# Patient Record
Sex: Female | Born: 1955
Health system: Southern US, Community
[De-identification: ages and names within clinical notes are randomized; demographics above are authoritative.]

## PROBLEM LIST (undated history)

## (undated) DIAGNOSIS — H50811 Duane's syndrome, right eye: Secondary | ICD-10-CM

## (undated) DIAGNOSIS — I10 Essential (primary) hypertension: Secondary | ICD-10-CM

## (undated) HISTORY — PX: EYE SURGERY: SHX253

---

## 1997-09-01 ENCOUNTER — Ambulatory Visit (HOSPITAL_COMMUNITY): Admission: RE | Admit: 1997-09-01 | Discharge: 1997-09-01 | Payer: Self-pay | Admitting: Obstetrics and Gynecology

## 1998-09-06 ENCOUNTER — Ambulatory Visit (HOSPITAL_BASED_OUTPATIENT_CLINIC_OR_DEPARTMENT_OTHER): Admission: RE | Admit: 1998-09-06 | Discharge: 1998-09-06 | Payer: Self-pay | Admitting: Plastic Surgery

## 1998-09-18 ENCOUNTER — Ambulatory Visit (HOSPITAL_COMMUNITY): Admission: RE | Admit: 1998-09-18 | Discharge: 1998-09-18 | Payer: Self-pay | Admitting: Obstetrics and Gynecology

## 1998-09-18 ENCOUNTER — Encounter: Payer: Self-pay | Admitting: Obstetrics and Gynecology

## 1998-12-12 ENCOUNTER — Other Ambulatory Visit: Admission: RE | Admit: 1998-12-12 | Discharge: 1998-12-12 | Payer: Self-pay | Admitting: Obstetrics and Gynecology

## 1999-04-23 ENCOUNTER — Ambulatory Visit (HOSPITAL_COMMUNITY): Admission: RE | Admit: 1999-04-23 | Discharge: 1999-04-23 | Payer: Self-pay | Admitting: Obstetrics and Gynecology

## 1999-04-23 ENCOUNTER — Encounter: Payer: Self-pay | Admitting: Obstetrics and Gynecology

## 1999-10-09 ENCOUNTER — Encounter: Payer: Self-pay | Admitting: Obstetrics and Gynecology

## 1999-10-09 ENCOUNTER — Encounter: Admission: RE | Admit: 1999-10-09 | Discharge: 1999-10-09 | Payer: Self-pay | Admitting: Obstetrics and Gynecology

## 2000-02-21 ENCOUNTER — Other Ambulatory Visit: Admission: RE | Admit: 2000-02-21 | Discharge: 2000-02-21 | Payer: Self-pay | Admitting: Obstetrics and Gynecology

## 2000-10-27 ENCOUNTER — Ambulatory Visit (HOSPITAL_COMMUNITY): Admission: RE | Admit: 2000-10-27 | Discharge: 2000-10-27 | Payer: Self-pay | Admitting: Obstetrics and Gynecology

## 2000-10-27 ENCOUNTER — Encounter: Payer: Self-pay | Admitting: Obstetrics and Gynecology

## 2001-04-21 ENCOUNTER — Other Ambulatory Visit: Admission: RE | Admit: 2001-04-21 | Discharge: 2001-04-21 | Payer: Self-pay | Admitting: Obstetrics and Gynecology

## 2001-10-27 ENCOUNTER — Encounter: Admission: RE | Admit: 2001-10-27 | Discharge: 2001-10-27 | Payer: Self-pay | Admitting: Obstetrics and Gynecology

## 2001-10-27 ENCOUNTER — Encounter: Payer: Self-pay | Admitting: Obstetrics and Gynecology

## 2002-08-02 ENCOUNTER — Other Ambulatory Visit: Admission: RE | Admit: 2002-08-02 | Discharge: 2002-08-02 | Payer: Self-pay | Admitting: Obstetrics and Gynecology

## 2002-12-13 ENCOUNTER — Encounter: Admission: RE | Admit: 2002-12-13 | Discharge: 2002-12-13 | Payer: Self-pay | Admitting: Obstetrics and Gynecology

## 2002-12-13 ENCOUNTER — Encounter: Payer: Self-pay | Admitting: Obstetrics and Gynecology

## 2003-10-20 ENCOUNTER — Other Ambulatory Visit: Admission: RE | Admit: 2003-10-20 | Discharge: 2003-10-20 | Payer: Self-pay | Admitting: Obstetrics and Gynecology

## 2004-03-14 ENCOUNTER — Encounter: Admission: RE | Admit: 2004-03-14 | Discharge: 2004-03-14 | Payer: Self-pay | Admitting: Obstetrics and Gynecology

## 2005-04-26 ENCOUNTER — Encounter: Admission: RE | Admit: 2005-04-26 | Discharge: 2005-04-26 | Payer: Self-pay | Admitting: Obstetrics and Gynecology

## 2006-09-02 ENCOUNTER — Encounter: Admission: RE | Admit: 2006-09-02 | Discharge: 2006-09-02 | Payer: Self-pay | Admitting: Obstetrics and Gynecology

## 2007-11-11 ENCOUNTER — Encounter: Admission: RE | Admit: 2007-11-11 | Discharge: 2007-11-11 | Payer: Self-pay | Admitting: Obstetrics and Gynecology

## 2008-11-11 ENCOUNTER — Encounter: Admission: RE | Admit: 2008-11-11 | Discharge: 2008-11-11 | Payer: Self-pay | Admitting: Obstetrics and Gynecology

## 2009-11-24 ENCOUNTER — Encounter: Admission: RE | Admit: 2009-11-24 | Discharge: 2009-11-24 | Payer: Self-pay | Admitting: Obstetrics and Gynecology

## 2010-09-07 ENCOUNTER — Encounter: Payer: Self-pay | Admitting: Sports Medicine

## 2010-09-07 ENCOUNTER — Ambulatory Visit (INDEPENDENT_AMBULATORY_CARE_PROVIDER_SITE_OTHER): Payer: BC Managed Care – PPO | Admitting: Sports Medicine

## 2010-09-07 DIAGNOSIS — S79919A Unspecified injury of unspecified hip, initial encounter: Secondary | ICD-10-CM

## 2010-09-07 DIAGNOSIS — R1030 Lower abdominal pain, unspecified: Secondary | ICD-10-CM

## 2010-09-07 DIAGNOSIS — M25561 Pain in right knee: Secondary | ICD-10-CM | POA: Insufficient documentation

## 2010-09-07 DIAGNOSIS — M25569 Pain in unspecified knee: Secondary | ICD-10-CM

## 2010-09-07 DIAGNOSIS — R109 Unspecified abdominal pain: Secondary | ICD-10-CM

## 2010-09-07 DIAGNOSIS — S76201A Unspecified injury of adductor muscle, fascia and tendon of right thigh, initial encounter: Secondary | ICD-10-CM

## 2010-09-07 MED ORDER — NITROGLYCERIN 0.2 MG/HR TD PT24: MEDICATED_PATCH | TRANSDERMAL | Status: DC

## 2010-09-07 NOTE — Assessment & Plan Note (Addendum)
-   Right groin pain secondary to partial tear of hip adductor which was noted on [facility] ultrasound. - Initiate nitroglycerin protocol as noted in patient instructions - reviewed common side effects including headache and rash.  - Fitted with body helix by sleeve to wear with activity for added compression - Start gentle home exercise program as noted in patient instructions - Followup 6 weeks for reevaluation and repeat ultrasound

## 2010-09-07 NOTE — Assessment & Plan Note (Signed)
Right medial knee pain likely secondary to altered gait mechanics from abductor injury. Was noted to have increased right foot turned out with walking which is likely putting more stress on the medial aspect of her knee. This is less noticeable with running gait. Suspect managing abductor injury will improve her knee pain. No acute abnormalities appreciated on ultrasound of the knee.

## 2010-09-07 NOTE — Progress Notes (Signed)
  Subjective:    Patient ID: Natasha Turner, female    DOB: Jan 18, 1956, 55 y.o.   MRN: [id number]  HPI The 55 year old active female to the office for evaluation of right knee and right groin pain. Pain has been ongoing for the past 6 weeks, denies any specific injury or trauma. She runs regularly approximately 20 miles a week, but is also doing yoga. She's been noting increasing pain in the medial knee and groin with stairs and using the elliptical backwards. No pain with using the elliptical forward or with stationary bike. Also noting increased pain with certain motions including sitting [place] style and certain yoga positions. Has been taking Motrin as needed with some relief. Denies any previous injury to her knee or her groin. Denies any bruising or swelling. Denies any mechanical symptoms in the knee.   Review of Systems Per history of present illness, otherwise negative    Objective:   Physical Exam GENERAL: Alert and oriented x3, no acute distress, pleasant SKIN: No rashes or lesions MSK: - Knee: Right knee with full range of motion with minimal patellofemoral crepitus. She has no swelling or effusion. She is mildly tender palpation along the lateral patellar facet, no tenderness over the medial or lateral joint lines, patellar tendon, quadriceps tendon. She has negative patellar apprehension. She has positive patellar grind. No ligamentous laxity. Negative McMurray's. Left knee with full range of motion without pain, tenderness, swelling, weakness, or instability. - Hip:  Right hip with full range of motion, does have some pain with external rotation. Tender to palpation along adductor musculature and muscle belly with palpable nodule, no defects are appreciated. No surrounding erythema or bruising. Mild weakness of the abductor musculature secondary to pain.  Mild pain & weakness with sartorius testing. Otherwise hip strength is normal. Normal hip strength on the left. - Gait: No leg  length difference. With walking and noted to have external rotation of her right foot. No limp. Good running form with the forefoot and mid foot strike, right foot is more midline with running. Neurovascular intact distally  [facility]K ultrasound: - Right knee with normal-appearing quadriceps tendon, no increase fluid in suprapatellar pouch. Normal-appearing radial and lateral meniscus. Normal-appearing patellar tendon. - Right adductor with hyperechoic area within the muscle belly consistent with previous injury/tearing and formation of scar tissue.  Areas of hypoechoic change within muscle belly with surrounding increased Doppler flow. Images saved       Assessment & Plan:

## 2010-09-07 NOTE — Patient Instructions (Signed)
1) Your ultrasound showed scar tissue within your adductor & some increased blood flow consistent with healing old tear. 2) Start NTG 1/4 patch to right groin - leave in place x 24 hrs, then change daily. 3) Wear thigh sleeve with activity. 4) Start doing exercises: - Hip adduction - 3 sets of 30 without weight (should not cause pain >3/10) - Hip abduction - 3 sets of 30 without weight - Quad sets with adduction using a ball --> squeeze ball between the knees and do leg extension - 3 sets of 30 without weight (should not cause pain >3/10) 5) Ok to continue running & yoga - avoid any activities that are causing pain >3/10 6) Follow-up 6-weeks for repeat ultrasound

## 2010-09-07 NOTE — Assessment & Plan Note (Signed)
Right groin pain secondary to partial tearing of adductor musculature as noted on [facility] ultrasound. Thigh sleeve, nitroglycerin protocol, home exercises as noted above. Reevaluate in 6 weeks.

## 2010-10-16 ENCOUNTER — Encounter: Payer: Self-pay | Admitting: Family Medicine

## 2010-10-16 ENCOUNTER — Ambulatory Visit (INDEPENDENT_AMBULATORY_CARE_PROVIDER_SITE_OTHER): Payer: BC Managed Care – PPO | Admitting: Family Medicine

## 2010-10-16 VITALS — BP 138/82 | HR 73

## 2010-10-16 DIAGNOSIS — S76201A Unspecified injury of adductor muscle, fascia and tendon of right thigh, initial encounter: Secondary | ICD-10-CM

## 2010-10-16 DIAGNOSIS — S79929A Unspecified injury of unspecified thigh, initial encounter: Secondary | ICD-10-CM

## 2010-10-16 NOTE — Assessment & Plan Note (Signed)
-   Right groin pain secondary to partial tear of hip adductor - 50% improved today and signs of healing and increased Doppler flow on ultrasound today - Continue nitroglycerin protocol quarter patch daily - Continue to use body helix by sleeve - Continue hip exercises - Followup 6-8 weeks for repeat ultrasound and reevaluation

## 2010-10-16 NOTE — Progress Notes (Signed)
  Subjective:    Patient ID: Natasha Turner, female    DOB: November 15, 1955, 55 y.o.   MRN: [id number]  HPI 55 year old female to our office for followup of partial tear of right abductor muscles. Symptoms 50% improved with nitroglycerin protocol and home exercises. Denies any adverse effects from the nitroglycerin. Has been wearing thigh sleeve which is comfortable. Has been running 16-20 miles per week which has been comfortable. Also doing hot yoga.   Review of Systems Per history of present illness, otherwise negative    Objective:   Physical Exam GENERAL: Alert and oriented x3, no acute distress, pleasant  MSK:  - Hip: Right hip with full range of motion, does have slight pain with external rotation. Mildly tender to palpation along adductor musculature and muscle belly with palpable nodule, no defects are appreciated. No surrounding erythema or bruising. Mild weakness of the abductor musculature - but improving. Mild pain & weakness with sartorius testing - but improving. Otherwise hip strength is normal. Normal hip strength on the left.  - Gait: No leg length difference. With walking and noted to have external rotation of her right foot. No limp.  Neurovascular intact distally   [facility]K ultrasound:   Right adductor with hyperechoic area within the muscle belly consistent with previous injury/tearing and formation of scar tissue - area appears smaller today compared to previous u/s. Decreased areas of hypoechoic change within muscle belly.  Increased doppler flow & neovessels noted.  Images saved        Assessment & Plan:

## 2010-11-23 ENCOUNTER — Other Ambulatory Visit: Payer: Self-pay | Admitting: Obstetrics & Gynecology

## 2010-11-23 DIAGNOSIS — Z1231 Encounter for screening mammogram for malignant neoplasm of breast: Secondary | ICD-10-CM

## 2010-12-03 ENCOUNTER — Ambulatory Visit
Admission: RE | Admit: 2010-12-03 | Discharge: 2010-12-03 | Disposition: A | Discharge status: Home / Self Care | Payer: BC Managed Care – PPO | Source: Ambulatory Visit | Attending: Obstetrics & Gynecology | Admitting: Obstetrics & Gynecology

## 2010-12-03 DIAGNOSIS — Z1231 Encounter for screening mammogram for malignant neoplasm of breast: Secondary | ICD-10-CM

## 2010-12-24 ENCOUNTER — Ambulatory Visit (INDEPENDENT_AMBULATORY_CARE_PROVIDER_SITE_OTHER): Payer: BC Managed Care – PPO | Admitting: Sports Medicine

## 2010-12-24 VITALS — BP 138/88

## 2010-12-24 DIAGNOSIS — S83249A Other tear of medial meniscus, current injury, unspecified knee, initial encounter: Secondary | ICD-10-CM | POA: Insufficient documentation

## 2010-12-24 DIAGNOSIS — IMO0002 Reserved for concepts with insufficient information to code with codable children: Secondary | ICD-10-CM

## 2010-12-24 DIAGNOSIS — M25569 Pain in unspecified knee: Secondary | ICD-10-CM

## 2010-12-24 DIAGNOSIS — M25561 Pain in right knee: Secondary | ICD-10-CM

## 2010-12-24 NOTE — Assessment & Plan Note (Signed)
She was given a compression sleeve for her knee today  I think she should wear this for the next 4-6 weeks  She is to limit activity to what does not increase her knee pain  Begin some straight leg raises and limited knee extensions  Okay to try the bike and other work adequately but does not create pain  Recheck in 4-6 weeks

## 2010-12-24 NOTE — Progress Notes (Signed)
  Subjective:    Patient ID: Natasha Turner, female    DOB: 1956-05-08, 55 y.o.   MRN: [id number]  HPI  On august 4 coming down step;  Rotated to RT and immediately twisted RT knee. Felt pain in RT medial knee and post area. Swelling started in < hour along med jt line.  Hard to walk. Could not straighten knee completely for 3 to 4 days. Now with pain that is more w use.   Using on OTC sleeve and icing.  Knee is still painful if she tries running or any fast walking  Review of Systems     Objective:   Physical Exam  No acute distress  Left Knee: Normal to inspection with no erythema or effusion or obvious bony abnormalities. Palpation normal with no warmth or joint line tenderness or patellar tenderness or condyle tenderness. ROM normal in flexion and extension and lower leg rotation. Ligaments with solid consistent endpoints including ACL, PCL, LCL, MCL. Negative Mcmurray's and provocative meniscal tests. Non painful patellar compression. Patellar and quadriceps tendons unremarkable. Hamstring and quadriceps strength is normal.  RT Knee Pain along the medial joint line is mild Unable to fully extend the right knee by 3 McMurray test is not painful nor is the [place] However the right knee does not feel as stable Ligaments do appear stable  [facility] ultrasound There is a small amount of effusion persisting in the supra patellar pouch Quadriceps tendon and patellar tendon are intact Lateral meniscus intact Medial meniscus shows a small area that is a possible separated fragment only in the posterior half of the joint line but not posteriorly        Assessment & Plan:

## 2010-12-24 NOTE — Assessment & Plan Note (Signed)
Pain is lessened she will continue using over-the-counter medications

## 2011-02-18 ENCOUNTER — Ambulatory Visit (INDEPENDENT_AMBULATORY_CARE_PROVIDER_SITE_OTHER): Payer: BC Managed Care – PPO | Admitting: Family Medicine

## 2011-02-18 ENCOUNTER — Encounter: Payer: Self-pay | Admitting: Family Medicine

## 2011-02-18 DIAGNOSIS — E785 Hyperlipidemia, unspecified: Secondary | ICD-10-CM

## 2011-02-18 NOTE — Progress Notes (Signed)
Medical Nutrition Therapy:  Appt start time: 1130 end time:  1230.  Assessment:  Primary concerns today: hyperlipidemia.  Natasha Turner had her lipid panel and FBG done in August, and was surprised to see an LDL of 174 and FBG of 111.  Since June Natasha Turner has eaten especially well, as her daughter started doing [employer], and she has lost from ~120 lb.  She tore her R meniscus in Aug, which has limited exercise some (usual physical activity used to be yoga 4-5 X wk and running 5-6 X wk [20 mpw]), but she has become even more careful with her diet b/c of lab results, and weight has continued to fall.   Usual eating pattern includes 3 meals and 2 snacks per day.  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 her BG levels as well as LDL.  (HDL is 88.) Everyday foods include ~32 oz unsweetened tea, and frequently consumed foods include sweet potatoes, peppers, black beans, and [place].  Avoided foods include liver.   24-hr recall suggests an intake of <1500 kcal: B (9 AM)- 3/4 c eggs & sausage casserole, black coffee; L (driving back from [place]) (1 PM)- 1 Kashi bar (140 kcal), water & tea; (30 min bike ride); Snk (3:30 PM)- 1/4 c blkeyed peas, salsa, tomatoes, 10 flax chips, <2 Tbsp hummus, 4 pita chips, tea; D (7:30 PM)- [place] rest:  2 1/2 crispy shrimp, sea bass, 1/3 c rice, broccoli, water; Snk (9 PM)- hot green tea.  She was at a church retreat yesterday.   Lunch on weekdays is usually ~12 oz red beans and lentils or leftover soup with a piece of fruit.  Snacks are usually Kashi bars or fruit.    Progress Towards Goal(s):  In progress.   Nutritional Diagnosis:  NI-5.8.5 Inadeqate fiber intake As related to fruits and vegetables.  As evidenced by usual intake of no veg's at lunch time.    Intervention:  Nutrition education.  Monitoring/Evaluation:  Dietary intake, exercise, and body weight prn.

## 2011-02-18 NOTE — Patient Instructions (Addendum)
-   Principles of lipid management:  - Limit saturated fat (red meat, whole milk dairy foods, butter, fried foods).   (Continue to limit cheese to 1-2 oz a few times a week.)  - Limit trans fat (fried foods, some condiments, some baked foods [read labels])  - Incorporate more polyunsaturated fats (liquid at room temperature) and mono-unsaturated fats (olive & canola oils)   - Include unsalted nuts and seeds, and avocado.   - Increase soluble fiber (flax seeds, fruit, vegetables, and beans) and cereal grains (bran).  In choosing cereal, look for at least 5 grams of fiber per serving.    - Have fish (nonfried) at least twice a week.   - For your BG, including some protein each time you eat is a good idea.   - Suggestion:  [employer] brand frozen wild blueberries with [place] yogurt and mixed nuts.   - When you have beans for your main protein source, you need at least a full cup.   - Oatmeal is a good source of soluble fiber.  A quick way to prepare it is to soak thick rolled oats in boiling water, covered, overnight, then cook briefly in the morning.   - Alcohol:  Continue to consume moderately (no more than one drink per time; no more than 4 X wk).   - Vegetables:  Obtain twice a day! - When you have hummus, use a combination of veg's and whole grain pita.

## 2011-02-20 ENCOUNTER — Ambulatory Visit (INDEPENDENT_AMBULATORY_CARE_PROVIDER_SITE_OTHER): Payer: BC Managed Care – PPO | Admitting: Sports Medicine

## 2011-02-20 ENCOUNTER — Ambulatory Visit (HOSPITAL_COMMUNITY)
Admission: RE | Admit: 2011-02-20 | Discharge: 2011-02-20 | Disposition: A | Discharge status: Home / Self Care | Payer: BC Managed Care – PPO | Source: Ambulatory Visit | Attending: Sports Medicine | Admitting: Sports Medicine

## 2011-02-20 ENCOUNTER — Encounter: Payer: Self-pay | Admitting: Sports Medicine

## 2011-02-20 VITALS — BP 130/85 | HR 67 | Ht 63.0 in | Wt 109.0 lb

## 2011-02-20 DIAGNOSIS — S83249A Other tear of medial meniscus, current injury, unspecified knee, initial encounter: Secondary | ICD-10-CM

## 2011-02-20 DIAGNOSIS — IMO0002 Reserved for concepts with insufficient information to code with codable children: Secondary | ICD-10-CM

## 2011-02-20 DIAGNOSIS — M25569 Pain in unspecified knee: Secondary | ICD-10-CM | POA: Insufficient documentation

## 2011-02-20 DIAGNOSIS — M25561 Pain in right knee: Secondary | ICD-10-CM

## 2011-02-20 DIAGNOSIS — M25562 Pain in left knee: Secondary | ICD-10-CM

## 2011-02-20 NOTE — Progress Notes (Signed)
PT referral info faxed to [facility] per patient's request.

## 2011-02-20 NOTE — Progress Notes (Signed)
  Subjective:    Patient ID: Natasha Turner, female    DOB: 03/14/1956, 55 y.o.   MRN: [id number]  HPI   On august 4 coming down step;  Rotated to RT and immediately twisted RT knee. Felt pain in RT medial knee and post area. Swelling started in < hour along med jt line.  Hard to walk. Could not straighten knee completely for 3 to 4 days. Most recently seen approx 6 weeks ago and since then has been doing home exercises and wearing knee sleeve. With slow return to activity.  Denies significant pain, admits to intermittent clicking and locking with "almost giving out:" feeling more medially. Admits to intermittent pain/discomfort laterally and extending down lateral lower leg. Unable to fully extend leg and gets pressure posteriorly in knee after approx 30 minutes of walking/running.  Review of Systems      Objective:   Physical Exam   GEN: NAD, well developed CV: distal pulses intact Lung: normal resp effort  Left Knee: no assymetry/erythema/swelling. nontender to touch. FROM. 5/5 strength. Intact joint.  RT Knee: slightly decrease in VMO size compared to L. Mild ttp superior to fibular head, no medial joint line tenderness. Full flexion with mild pressure posteriorly. Decreased extension by approx 3 degree. 5/5 strength in flexion. Hamstring with decreased strength compared to L.  [facility] ultrasound R knee: There is improved effusion in suprapatellar pouch. Quadriceps tendon and patellar tendon are intact Lateral meniscus with slight calcification and thinner when compared to L. Medial meniscus with improved appearance and mild thinning posteriorly.       Assessment & Plan:  *R knee pain 2/2 medial meniscal tear with possible lateral meniscal involvement -will get standing b/l xrays today to monitor osteochondral status -continue with home exercises and wearing knee sleeve -will refer to PT for improved effort in achieving full extension in R knee  Note with bilateral knee  issues and good response to body helix on 1 knee we will try 1 for left knee as well

## 2011-02-20 NOTE — Patient Instructions (Signed)
Home Exercise Program 3 sets of 15  Cross over step ups Hip flexion with rotation Straight leg raises lateral Straight hip flexion

## 2011-02-20 NOTE — Assessment & Plan Note (Signed)
Pain and function improved  Still with extension deficit  Standing knee films done and no significant DJD  With this in mind I think we should move to using PT and try to improve strength and get full Extension on RT  Reck P 6 wks of PT

## 2011-02-20 NOTE — Assessment & Plan Note (Signed)
RT Meniscus functions seems essentially normal now  On scan she has small fragment of lateral meniscus Thinning of medial meniscus in 1 area  Cont to observe

## 2011-04-11 ENCOUNTER — Encounter: Payer: Self-pay | Admitting: Sports Medicine

## 2011-04-11 ENCOUNTER — Ambulatory Visit (INDEPENDENT_AMBULATORY_CARE_PROVIDER_SITE_OTHER): Payer: BC Managed Care – PPO | Admitting: Sports Medicine

## 2011-04-11 VITALS — BP 128/88 | HR 99

## 2011-04-11 DIAGNOSIS — S83249A Other tear of medial meniscus, current injury, unspecified knee, initial encounter: Secondary | ICD-10-CM | POA: Insufficient documentation

## 2011-04-11 DIAGNOSIS — IMO0002 Reserved for concepts with insufficient information to code with codable children: Secondary | ICD-10-CM

## 2011-04-11 DIAGNOSIS — M25561 Pain in right knee: Secondary | ICD-10-CM

## 2011-04-11 DIAGNOSIS — M25569 Pain in unspecified knee: Secondary | ICD-10-CM

## 2011-04-11 MED ORDER — MELOXICAM 15 MG PO TABS: 15.0000 mg | ORAL_TABLET | Freq: Every day | ORAL | Status: AC

## 2011-04-11 NOTE — Patient Instructions (Signed)
Mobic 15 mg po qd for 2 weeks Continue physical therapy  Consider Devil's claw Continue Knee sleeve with activities. F/U in January 2013

## 2011-04-11 NOTE — Progress Notes (Signed)
  Subjective:    Patient ID: Natasha Turner, female    DOB: 07/15/1955, 55 y.o.   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 She has been doing PT for 12 visit. She states that she has improved 70-75%. Her pain is mild, medial, 2/10 intensity, no radiated, mild swelling and pressure on and off. Pain with walking down stairs, catching once a week. However she is able to run 3 mi a day in the treadmill and after running and activities is when she feel most of the discomfort.  Patient Active Problem List  Diagnoses  . Knee pain, right  . Injury of adductor muscle, fascia, and tendon of right thigh  . Groin pain  . Tear of medial meniscus of knee  . Hyperlipidemia with target LDL less than 160  . Knee pain, bilateral   Current Outpatient Prescriptions on File Prior to Visit  Medication Sig Dispense Refill  . cholecalciferol (VITAMIN D) 1000 UNITS tablet Take 2,000 Units by mouth daily.        Marland Kitchen ELESTRIN 0.52 MG/0.87 GM (0.06%) GEL       . fish oil-omega-3 fatty acids 1000 MG capsule Take 2,400 mg by mouth daily.        . nitroGLYCERIN (NITRODUR - DOSED IN MG/24 HR) 0.2 mg/hr Apply 1/4 patch to right groin, leave in place x 24 hours and change daily  15 patch  2  . Phytosterol Esters (PHYTOSTEROL COMPLEX PO) Take 2,000 mg by mouth.        . progesterone (PROMETRIUM) 100 MG capsule Take 100 mg by mouth Daily.       Allergies  Allergen Reactions  . Penicillins            Review of Systems  Constitutional: Negative for fever, chills, diaphoresis and fatigue.  Musculoskeletal: Negative for back pain, joint swelling and gait problem.  Neurological: Negative for weakness and numbness.       Objective:   Physical Exam  Constitutional: She appears well-developed and well-nourished.       BP 128/88  Pulse 99   Pulmonary/Chest: Effort normal.  Musculoskeletal:       Right knee with intact skin, Extension  with -5 , full flexion. Patellofemoral mild crepitus present with flexion and extension. Patellofemoral compression  test +. No tenderness on the quad neither or patellar tendon. Ligaments intact. Lachman neg. Varus and valgus test at 0 and 30 degres neg Mild TTP in the mid joint line. Excellent quad muscle definition.  Tight hamstrings Normal gait without a limp.     Neurological: She is alert.  Skin: Skin is warm. No rash noted. No erythema. No pallor.  Psychiatric: She has a normal mood and affect.      Assessment & Plan:   1. Knee pain, right   2. Acute medial meniscal tear    Mobic 15 mg po qd for 2 weeks Continue physical therapy  Consider Devil's claw Continue Knee sleeve with activities. F/U in January 2013

## 2011-09-25 ENCOUNTER — Ambulatory Visit (INDEPENDENT_AMBULATORY_CARE_PROVIDER_SITE_OTHER): Payer: BC Managed Care – PPO | Admitting: Family Medicine

## 2011-09-25 VITALS — BP 118/80

## 2011-09-25 DIAGNOSIS — IMO0002 Reserved for concepts with insufficient information to code with codable children: Secondary | ICD-10-CM

## 2011-09-25 DIAGNOSIS — S83249A Other tear of medial meniscus, current injury, unspecified knee, initial encounter: Secondary | ICD-10-CM

## 2011-10-08 NOTE — Progress Notes (Signed)
  Subjective:    Patient ID: Natasha Turner, female    DOB: 03-Dec-1955, 56 y.o.   MRN: [id number]  HPI 56 y/o female is following up for right knee pain.  She was diagnosed with a medial meniscus tear August 2012.  She has been trying to manage it conservatively but the pain is not resolved.  Recently she feels that it is a bit worse.  She has some catching but no locking.  She has returned to running and is able to run up to 4 miles.  She feels like she may have something inside of her knee.   Review of Systems     Objective:   Physical Exam  Knee: Normal to inspection with no erythema or effusion or obvious bony abnormalities. Mild joint line tenderness but no true Mcmurray. ROM normal in flexion and extension and lower leg rotation. Positive crepitus Negative Mcmurray's and provocative meniscal tests. Non painful patellar compression. Patellar and quadriceps tendons unremarkable.   Ultrasound: No effusion noted in the suprapatellar pouch.  No sign of a loose body.  Calcifications noted on the medial meniscus c/w her history of a previous tear.       Assessment & Plan:

## 2011-10-08 NOTE — Assessment & Plan Note (Signed)
We had a long discussion regarding whether or not she would like to pursue surgical evaluation for this knee pain.  We discussed absolute indications for surgical referral, relative indications for surgical referral, and continued conservative treatment.  Dr. Darrick Penna also reviewed these options with the patient.  She doesn't want to make any changes at this time.  She will contact Dr. Darrick Penna if she decides otherwise.

## 2011-12-19 ENCOUNTER — Other Ambulatory Visit: Payer: Self-pay | Admitting: Obstetrics & Gynecology

## 2011-12-19 DIAGNOSIS — Z1231 Encounter for screening mammogram for malignant neoplasm of breast: Secondary | ICD-10-CM

## 2011-12-20 ENCOUNTER — Other Ambulatory Visit: Payer: Self-pay | Admitting: Obstetrics & Gynecology

## 2011-12-20 DIAGNOSIS — M8580 Other specified disorders of bone density and structure, unspecified site: Secondary | ICD-10-CM

## 2012-01-07 ENCOUNTER — Ambulatory Visit
Admission: RE | Admit: 2012-01-07 | Discharge: 2012-01-07 | Disposition: A | Discharge status: Home / Self Care | Payer: BC Managed Care – PPO | Source: Ambulatory Visit | Attending: Obstetrics & Gynecology | Admitting: Obstetrics & Gynecology

## 2012-01-07 DIAGNOSIS — M8580 Other specified disorders of bone density and structure, unspecified site: Secondary | ICD-10-CM

## 2012-01-07 DIAGNOSIS — Z1231 Encounter for screening mammogram for malignant neoplasm of breast: Secondary | ICD-10-CM

## 2013-01-05 ENCOUNTER — Other Ambulatory Visit: Payer: Self-pay

## 2013-01-05 DIAGNOSIS — Z1231 Encounter for screening mammogram for malignant neoplasm of breast: Secondary | ICD-10-CM

## 2013-01-14 ENCOUNTER — Ambulatory Visit
Admission: RE | Admit: 2013-01-14 | Discharge: 2013-01-14 | Disposition: A | Discharge status: Home / Self Care | Payer: BC Managed Care – PPO | Source: Ambulatory Visit

## 2013-01-14 DIAGNOSIS — Z1231 Encounter for screening mammogram for malignant neoplasm of breast: Secondary | ICD-10-CM

## 2013-07-28 ENCOUNTER — Encounter: Payer: Self-pay | Admitting: Sports Medicine

## 2013-07-28 ENCOUNTER — Ambulatory Visit (INDEPENDENT_AMBULATORY_CARE_PROVIDER_SITE_OTHER): Payer: BC Managed Care – PPO | Admitting: Sports Medicine

## 2013-07-28 VITALS — BP 151/88 | HR 76 | Ht 63.0 in | Wt 109.0 lb

## 2013-07-28 DIAGNOSIS — M25561 Pain in right knee: Secondary | ICD-10-CM

## 2013-07-28 DIAGNOSIS — M25569 Pain in unspecified knee: Secondary | ICD-10-CM

## 2013-07-28 DIAGNOSIS — S83209A Unspecified tear of unspecified meniscus, current injury, unspecified knee, initial encounter: Secondary | ICD-10-CM

## 2013-07-28 DIAGNOSIS — IMO0002 Reserved for concepts with insufficient information to code with codable children: Secondary | ICD-10-CM

## 2013-07-28 NOTE — Patient Instructions (Signed)
Thank you for coming in today  1. Continue compression with running and walking 2. Take aleve or ibuprofen as needed 3. Work on quad strength          - Single leg squat at 45 degrees 3 x 20         - Continue spin class         - Single leg balance 3 x 30 sec          - Straight leg raise with toe straight, toe in, and toe out 3 x 15 4. Avoid deep squats, deep lunges, and steps  Followup as needed

## 2013-07-28 NOTE — Progress Notes (Signed)
CC: Followup right knee pain HPI: Patient is a very pleasant 58 year old female recreational runner who presents for right knee pain. She is still having some pinching in her right medial knee and occasionally it feels like it to give out. She does not really trust the knee. She has been doing spin classes 2-3 times a week and body pump classes weekly as well as yoga 3 times a week. She is at increased pain for the last several months after responding well to PT a couple years ago. Occasionally she will plans and given a sharp pain and pinching sensation. She tolerates spin class and yoga well but has some pain with walking and body pump. The knee feels stiff. She has been wearing a knee sleeve. She wants to make sure this is still appropriate.  ROS: As above in the HPI. All other systems are stable or negative.  OBJECTIVE: APPEARANCE:  Patient in no acute distress.The patient appeared well nourished and normally developed. HEENT: No scleral icterus. Conjunctiva non-injected Resp: Non labored Skin: No rash MSK:  Right Knee - Inspection normal with no erythema or effusion or obvious bony abnormalities.  - Palpation normal with no warmth but mild medial joint line tenderness. No TTP at patellar or quad tendon.  - ROM normal in flexion and extension. - Strength 5/5 in flexion and extension. - Ligaments with solid consistent endpoints including ACL, PCL, LCL, MCL.  - Painful Mcmurray's.  - Non painful patellar compression.  - Neurovascularly intact  MSK US: Limited ultrasound of the right knee was performed today. There is no increased hypoechoic fluid in the suprapatellar pouch. Medial joint line visualized from anterior to posterior. Anterior meniscus is normal in appearance. At the midportion of the medial meniscus at the site of the patient's pain there is decreased joint space as well as some spurring in this area. There is cystic change within the meniscus and thinning of the meniscus.  Posterior medial meniscus is normal and healthy in appearance.   ASSESSMENT: #1. 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 Continue compression sleeve during activity that precipitates pain. Avoid deep squats and lunges. Be careful a body pump classes to minimize painful activity. May also need to modify yoga positions. She was given a home exercise plan focusing on quad strengthening.

## 2013-08-10 ENCOUNTER — Ambulatory Visit: Payer: BC Managed Care – PPO | Admitting: Sports Medicine

## 2013-10-28 ENCOUNTER — Other Ambulatory Visit: Payer: Self-pay | Admitting: *Deleted

## 2013-10-28 DIAGNOSIS — M25561 Pain in right knee: Secondary | ICD-10-CM

## 2013-10-28 DIAGNOSIS — M25562 Pain in left knee: Secondary | ICD-10-CM

## 2013-10-28 DIAGNOSIS — S83249A Other tear of medial meniscus, current injury, unspecified knee, initial encounter: Secondary | ICD-10-CM

## 2014-01-10 ENCOUNTER — Other Ambulatory Visit: Payer: Self-pay

## 2014-01-10 DIAGNOSIS — Z1231 Encounter for screening mammogram for malignant neoplasm of breast: Secondary | ICD-10-CM

## 2014-01-12 ENCOUNTER — Ambulatory Visit (INDEPENDENT_AMBULATORY_CARE_PROVIDER_SITE_OTHER): Payer: BC Managed Care – PPO | Admitting: Sports Medicine

## 2014-01-12 ENCOUNTER — Encounter: Payer: Self-pay | Admitting: Sports Medicine

## 2014-01-12 VITALS — BP 145/97 | Ht 62.5 in | Wt 114.0 lb

## 2014-01-12 DIAGNOSIS — IMO0002 Reserved for concepts with insufficient information to code with codable children: Secondary | ICD-10-CM | POA: Diagnosis not present

## 2014-01-12 DIAGNOSIS — Z5189 Encounter for other specified aftercare: Secondary | ICD-10-CM | POA: Diagnosis not present

## 2014-01-12 DIAGNOSIS — S83241D Other tear of medial meniscus, current injury, right knee, subsequent encounter: Secondary | ICD-10-CM

## 2014-01-12 DIAGNOSIS — M25561 Pain in right knee: Secondary | ICD-10-CM

## 2014-01-12 DIAGNOSIS — M25569 Pain in unspecified knee: Secondary | ICD-10-CM

## 2014-01-12 NOTE — Progress Notes (Signed)
Patient ID: Natasha Turner, female   DOB: February 15, 1956, 57 y.o.   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 A lot of her symptoms did seem more patellofemoral  She feels much stronger since she is done the physical therapy 2 weeks ago she ran almost daily while on vacation That did not hurt her knee She is able to run up to about 5 miles before getting pain  She had one episode of sharp medial pain with some temporary locking in the right knee That was associated with some pain at the upper outer corner of the patella  A second problem is some sharp pain over the anterior left shoulder This seemed to occur after a body pump class It is still tender but does not feel weak  Examination No acute distress BP 145/97  Ht 5' 2.5" (1.588 m)  Wt 114 lb (51.71 kg)  BMI 20.51 kg/m2  RT Knee: Normal to inspection with no erythema or effusion or obvious bony abnormalities. Palpation normal with no warmth or joint line tenderness or patellar tenderness or condyle tenderness. ROM normal in flexion and extension and lower leg rotation. Ligaments with solid consistent endpoints including ACL, PCL, LCL, MCL. Negative Mcmurray's and provocative meniscal tests. Crepitation and mildly painful patellar compression. Patellar and quadriceps tendons unremarkable. Hamstring and quadriceps strength is normal.  She is now able to do step downsand 1 leg kneebends with good stability of the right knee  LT Shoulder: Inspection reveals no abnormalities, atrophy or asymmetry. Palpation is normal with no tenderness over AC joint or bicipital groove. ROM is full in all planes. Rotator cuff strength normal throughout. No signs of impingement with negative Neer and Hawkin's tests, empty can. She does get mild pain with testing  the supraspinatous and the subscapularis Speeds and Yergason's tests normal. No labral pathology noted with negative Obrien's, negative clunk and good stability. Normal scapular function observed. No painful arc and no drop arm sign. No apprehension sign  MSK ultrasound The right knee as compared to the left knee and there is no effusion in the suprapatellar pouch on either side Patellar and quad tendons normal Medial meniscus has one area of bulging but good cartilage preservation There is some degenerative meniscal tearing in the lateral meniscus  Left shoulder shows intact biceps, supraspinatous, subscapularis, infraspinatus and teres minor tendons

## 2014-01-12 NOTE — Assessment & Plan Note (Signed)
All she has some degenerative meniscal changes on ultrasound her examination is now normal and there is no swelling around the medial or lateral meniscus

## 2014-01-12 NOTE — Assessment & Plan Note (Signed)
This is improving with physical therapy Worked with physical therapy to develop a home program Continue using compression sleeve When necessary Aleve  Recheck 6 months

## 2014-01-18 ENCOUNTER — Ambulatory Visit
Admission: RE | Admit: 2014-01-18 | Discharge: 2014-01-18 | Disposition: A | Discharge status: Home / Self Care | Payer: BC Managed Care – PPO | Source: Ambulatory Visit

## 2014-01-18 DIAGNOSIS — Z1231 Encounter for screening mammogram for malignant neoplasm of breast: Secondary | ICD-10-CM

## 2014-04-13 ENCOUNTER — Other Ambulatory Visit (HOSPITAL_COMMUNITY)
Admission: RE | Admit: 2014-04-13 | Discharge: 2014-04-13 | Disposition: A | Discharge status: Home / Self Care | Payer: BC Managed Care – PPO | Source: Ambulatory Visit | Attending: Family Medicine | Admitting: Family Medicine

## 2014-04-13 ENCOUNTER — Other Ambulatory Visit: Payer: Self-pay | Admitting: Family Medicine

## 2014-04-13 DIAGNOSIS — Z01419 Encounter for gynecological examination (general) (routine) without abnormal findings: Secondary | ICD-10-CM | POA: Insufficient documentation

## 2014-04-14 LAB — CYTOLOGY - PAP

## 2014-11-28 ENCOUNTER — Encounter (HOSPITAL_BASED_OUTPATIENT_CLINIC_OR_DEPARTMENT_OTHER): Payer: Self-pay | Admitting: *Deleted

## 2014-11-29 ENCOUNTER — Ambulatory Visit: Payer: Self-pay | Admitting: Ophthalmology

## 2014-11-29 NOTE — H&P (Signed)
  Date of examination:  11-10-14  Indication for surgery: to correct misalignment of the eyes and allow some binocularity  Pertinent past medical history:  Past Medical History  Diagnosis Date  . Hypertension   . Duane's syndrome of right eye     Pertinent ocular history:  S/p strabismus surgery x 3 most recently 1972.  Exotropia documented to have been increasing since first seen by me '98.  Marked limitation of adduction of OD noted on first exam by me.  + fissure narrowing in attempted adduction OD, c/w Duane's.    Pertinent family history: No family history on file.  General:  Healthy appearing patient in no distress.    Eyes:    Acuity cc OD 20/20  OS 20/20  External: Within normal limits fissure narrowing OD in adduction  Anterior segment: Within normal limits x healed conj scars OU  Motility:   XT=45 in primary (with RHT 8).  XT 45 in R gaze, 70 in L gaze.  2-3- adduction OD.  1- abduction OD.    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    Plan: 1.  Explore right medial rectus muscle, advance/resect.  2.  Recess right lateral rectus muscle, adjustable.    Shara BlazingYOUNG,Natasha Turner

## 2014-11-30 ENCOUNTER — Encounter (HOSPITAL_BASED_OUTPATIENT_CLINIC_OR_DEPARTMENT_OTHER)
Admission: RE | Admit: 2014-11-30 | Discharge: 2014-11-30 | Disposition: A | Discharge status: Home / Self Care | Payer: BC Managed Care – PPO | Source: Ambulatory Visit | Attending: Ophthalmology | Admitting: Ophthalmology

## 2014-11-30 ENCOUNTER — Other Ambulatory Visit: Payer: Self-pay

## 2014-11-30 DIAGNOSIS — Z9889 Other specified postprocedural states: Secondary | ICD-10-CM | POA: Diagnosis not present

## 2014-11-30 DIAGNOSIS — H5010 Unspecified exotropia: Secondary | ICD-10-CM | POA: Diagnosis not present

## 2014-11-30 DIAGNOSIS — H50811 Duane's syndrome, right eye: Secondary | ICD-10-CM | POA: Diagnosis not present

## 2014-11-30 DIAGNOSIS — I10 Essential (primary) hypertension: Secondary | ICD-10-CM | POA: Diagnosis not present

## 2014-12-02 ENCOUNTER — Ambulatory Visit (HOSPITAL_BASED_OUTPATIENT_CLINIC_OR_DEPARTMENT_OTHER)
Admission: RE | Admit: 2014-12-02 | Discharge: 2014-12-02 | Disposition: A | Discharge status: Home / Self Care | Payer: BC Managed Care – PPO | Source: Ambulatory Visit | Attending: Ophthalmology | Admitting: Ophthalmology

## 2014-12-02 ENCOUNTER — Ambulatory Visit (HOSPITAL_BASED_OUTPATIENT_CLINIC_OR_DEPARTMENT_OTHER): Payer: BC Managed Care – PPO | Admitting: Certified Registered"

## 2014-12-02 ENCOUNTER — Encounter (HOSPITAL_BASED_OUTPATIENT_CLINIC_OR_DEPARTMENT_OTHER)
Admission: RE | Disposition: A | Discharge status: Home / Self Care | Payer: Self-pay | Source: Ambulatory Visit | Attending: Ophthalmology

## 2014-12-02 ENCOUNTER — Encounter (HOSPITAL_BASED_OUTPATIENT_CLINIC_OR_DEPARTMENT_OTHER): Payer: Self-pay | Admitting: Certified Registered"

## 2014-12-02 DIAGNOSIS — I10 Essential (primary) hypertension: Secondary | ICD-10-CM | POA: Insufficient documentation

## 2014-12-02 DIAGNOSIS — Z9889 Other specified postprocedural states: Secondary | ICD-10-CM | POA: Insufficient documentation

## 2014-12-02 DIAGNOSIS — H5010 Unspecified exotropia: Secondary | ICD-10-CM | POA: Diagnosis not present

## 2014-12-02 DIAGNOSIS — H50811 Duane's syndrome, right eye: Secondary | ICD-10-CM | POA: Insufficient documentation

## 2014-12-02 HISTORY — PX: STRABISMUS SURGERY: SHX218

## 2014-12-02 HISTORY — DX: Duane's syndrome, right eye: H50.811

## 2014-12-02 HISTORY — DX: Essential (primary) hypertension: I10

## 2014-12-02 LAB — POCT HEMOGLOBIN-HEMACUE: Hemoglobin: 13.8 g/dL (ref 12.0–15.0)

## 2014-12-02 SURGERY — REPAIR STRABISMUS
Anesthesia: General | Site: Eye | Laterality: Right

## 2014-12-02 MED ORDER — OXYCODONE HCL 5 MG PO TABS: 5.0000 mg | ORAL_TABLET | Freq: Once | ORAL | Status: DC | PRN

## 2014-12-02 MED ORDER — ONDANSETRON HCL 4 MG/2ML IJ SOLN: 4.0000 mg | Freq: Four times a day (QID) | INTRAMUSCULAR | Status: DC | PRN

## 2014-12-02 MED ORDER — LIDOCAINE HCL (CARDIAC) 20 MG/ML IV SOLN
INTRAVENOUS | Status: DC | PRN
  Administered 2014-12-02: 70 mg via INTRAVENOUS

## 2014-12-02 MED ORDER — OXYCODONE HCL 5 MG/5ML PO SOLN: 5.0000 mg | Freq: Once | ORAL | Status: DC | PRN

## 2014-12-02 MED ORDER — LACTATED RINGERS IV SOLN
INTRAVENOUS | Status: DC
  Administered 2014-12-02 (×2): via INTRAVENOUS

## 2014-12-02 MED ORDER — PROPOFOL 10 MG/ML IV BOLUS
INTRAVENOUS | Status: DC | PRN
  Administered 2014-12-02: 150 mg via INTRAVENOUS

## 2014-12-02 MED ORDER — MIDAZOLAM HCL 2 MG/2ML IJ SOLN
INTRAMUSCULAR | Status: AC
  Filled 2014-12-02: qty 2

## 2014-12-02 MED ORDER — BSS IO SOLN
INTRAOCULAR | Status: DC | PRN
Start: 2014-12-02 — End: 2014-12-02
  Administered 2014-12-02: 10 mL

## 2014-12-02 MED ORDER — ONDANSETRON HCL 4 MG/2ML IJ SOLN
INTRAMUSCULAR | Status: DC | PRN
  Administered 2014-12-02: 4 mg via INTRAVENOUS

## 2014-12-02 MED ORDER — OXYCODONE-ACETAMINOPHEN 7.5-325 MG PO TABS: 1.0000 | ORAL_TABLET | ORAL | Status: AC | PRN

## 2014-12-02 MED ORDER — MIDAZOLAM HCL 2 MG/2ML IJ SOLN
1.0000 mg | INTRAMUSCULAR | Status: DC | PRN
  Administered 2014-12-02: 1 mg via INTRAVENOUS

## 2014-12-02 MED ORDER — FENTANYL CITRATE (PF) 100 MCG/2ML IJ SOLN
INTRAMUSCULAR | Status: AC
  Filled 2014-12-02: qty 4

## 2014-12-02 MED ORDER — TOBRAMYCIN-DEXAMETHASONE 0.3-0.1 % OP OINT
TOPICAL_OINTMENT | OPHTHALMIC | Status: DC | PRN
  Administered 2014-12-02: 1 via OPHTHALMIC

## 2014-12-02 MED ORDER — TOBRAMYCIN-DEXAMETHASONE 0.3-0.1 % OP OINT: 1.0000 "application " | TOPICAL_OINTMENT | Freq: Two times a day (BID) | OPHTHALMIC | Status: AC

## 2014-12-02 MED ORDER — KETOROLAC TROMETHAMINE 30 MG/ML IJ SOLN
INTRAMUSCULAR | Status: DC | PRN
  Administered 2014-12-02: 30 mg via INTRAVENOUS

## 2014-12-02 MED ORDER — DEXAMETHASONE SODIUM PHOSPHATE 4 MG/ML IJ SOLN
INTRAMUSCULAR | Status: DC | PRN
  Administered 2014-12-02: 10 mg via INTRAVENOUS

## 2014-12-02 MED ORDER — FENTANYL CITRATE (PF) 100 MCG/2ML IJ SOLN
INTRAMUSCULAR | Status: AC
  Filled 2014-12-02: qty 2

## 2014-12-02 MED ORDER — FENTANYL CITRATE (PF) 100 MCG/2ML IJ SOLN
25.0000 ug | INTRAMUSCULAR | Status: DC | PRN
  Administered 2014-12-02 (×2): 25 ug via INTRAVENOUS
  Administered 2014-12-02: 50 ug via INTRAVENOUS

## 2014-12-02 MED ORDER — GLYCOPYRROLATE 0.2 MG/ML IJ SOLN
0.2000 mg | Freq: Once | INTRAMUSCULAR | Status: AC | PRN
  Administered 2014-12-02: 0.2 mg via INTRAVENOUS

## 2014-12-02 MED ORDER — SCOPOLAMINE 1 MG/3DAYS TD PT72: 1.0000 | MEDICATED_PATCH | Freq: Once | TRANSDERMAL | Status: DC | PRN

## 2014-12-02 MED ORDER — FENTANYL CITRATE (PF) 100 MCG/2ML IJ SOLN
50.0000 ug | INTRAMUSCULAR | Status: AC | PRN
Start: 2014-12-02 — End: 2014-12-02
  Administered 2014-12-02 (×2): 50 ug via INTRAVENOUS
  Administered 2014-12-02: 25 ug via INTRAVENOUS
  Administered 2014-12-02: 50 ug via INTRAVENOUS

## 2014-12-02 SURGICAL SUPPLY — 32 items
APL SRG 3 HI ABS STRL LF PLS (MISCELLANEOUS) ×1
APPLICATOR COTTON TIP 6IN STRL (MISCELLANEOUS) ×12 IMPLANT
APPLICATOR DR MATTHEWS STRL (MISCELLANEOUS) ×3 IMPLANT
BANDAGE EYE OVAL (MISCELLANEOUS) ×4 IMPLANT
CAUTERY EYE LOW TEMP 1300F FIN (OPHTHALMIC RELATED) ×2 IMPLANT
CLOSURE WOUND 1/4X4 (GAUZE/BANDAGES/DRESSINGS)
COVER BACK TABLE 60X90IN (DRAPES) ×3 IMPLANT
COVER MAYO STAND STRL (DRAPES) ×3 IMPLANT
DRAPE SURG 17X23 STRL (DRAPES) ×6 IMPLANT
DRAPE U-SHAPE 76X120 STRL (DRAPES) IMPLANT
GLOVE BIO SURGEON STRL SZ 6.5 (GLOVE) ×2 IMPLANT
GLOVE BIO SURGEONS STRL SZ 6.5 (GLOVE) ×1
GLOVE BIOGEL M STRL SZ7.5 (GLOVE) ×6 IMPLANT
GOWN STRL REUS W/ TWL LRG LVL3 (GOWN DISPOSABLE) ×1 IMPLANT
GOWN STRL REUS W/TWL LRG LVL3 (GOWN DISPOSABLE) ×3
GOWN STRL REUS W/TWL XL LVL3 (GOWN DISPOSABLE) ×3 IMPLANT
NS IRRIG 1000ML POUR BTL (IV SOLUTION) ×3 IMPLANT
PACK BASIN DAY SURGERY FS (CUSTOM PROCEDURE TRAY) ×3 IMPLANT
SHEET MEDIUM DRAPE 40X70 STRL (DRAPES) ×2 IMPLANT
SLEEVE SCD COMPRESS KNEE MED (MISCELLANEOUS) ×3 IMPLANT
SPEAR EYE SURG WECK-CEL (MISCELLANEOUS) ×8 IMPLANT
STRIP CLOSURE SKIN 1/4X4 (GAUZE/BANDAGES/DRESSINGS) IMPLANT
SUT 6 0 SILK T G140 8DA (SUTURE) ×2 IMPLANT
SUT MERSILENE 6 0 S14 DA (SUTURE) IMPLANT
SUT PLAIN 6 0 TG1408 (SUTURE) ×2 IMPLANT
SUT SILK 4 0 C 3 735G (SUTURE) IMPLANT
SUT VICRYL 6 0 S 28 (SUTURE) ×2 IMPLANT
SUT VICRYL ABS 6-0 S29 18IN (SUTURE) ×2 IMPLANT
SYR TB 1ML LL NO SAFETY (SYRINGE) ×3 IMPLANT
SYRINGE 10CC LL (SYRINGE) ×3 IMPLANT
TOWEL OR 17X24 6PK STRL BLUE (TOWEL DISPOSABLE) ×3 IMPLANT
TRAY DSU PREP LF (CUSTOM PROCEDURE TRAY) ×3 IMPLANT

## 2014-12-02 NOTE — Anesthesia Postprocedure Evaluation (Signed)
Anesthesia Post Note  Patient: Natasha Turner  Procedure(s) Performed: Procedure(s) (LRB): REPAIR STRABISMUS RIGHT EYE (Right)  Anesthesia type: General  Patient location: PACU  Post pain: Pain level controlled and Adequate analgesia  Post assessment: Post-op Vital signs reviewed, Patient's Cardiovascular Status Stable, Respiratory Function Stable, Patent Airway and Pain level controlled  Last Vitals:  Filed Vitals:   12/02/14 1315  BP: 110/72  Pulse: 75  Temp: 36.9 C  Resp: 18    Post vital signs: Reviewed and stable  Level of consciousness: awake, alert  and oriented  Complications: No apparent anesthesia complications

## 2014-12-02 NOTE — Op Note (Signed)
12/02/2014  12:08 PM  PATIENT:  Natasha Turner    PRE-OPERATIVE DIAGNOSIS: 1.  Exotropia, incomitant     2.  Duane's syndrome, right eye  POST-OPERATIVE DIAGNOSIS:  same  PROCEDURE:  1. Lateral rectus muscle recession 10.0 mm mm right eye, adjustable   2.  Medial rectus muscle resection 5.0 mm/advancement 3.0 mm right eye  SURGEON:  Shara Blazing, MD  ANESTHESIA:   General  COMPLICATIONS: none  OPERATIVE PROCEDURE: After routine preoperative evaluation including informed consent, the patient was taken to the operating room where She was identified by me. General anesthesia was induced without difficulty after placement of appropriate monitors. The patient was prepped and draped in standard sterile fashion. A lid speculum was placed in the right eye.  Forced ductions showed significant resistance to forced adduction of the right eye.    A traction suture of 6-0 silk was placed at the temporal limbus.  A limbal conjunctival incision of 2 clock hours extent was made temporally in the right eye with Westcott scissors, with relaxing incisions in the superotemporal and inferotemporal quadrants.   The right lateral rectus muscle was engaged on a series of muscle hooks and cleared of its fascial attachments. Using an Ahmed grooved hook, the tendon was secured with a double-armed 6-0 Vicryl suture, with a locking bite at each border of the muscle, 1 mm from the insertion. The muscle was disinserted. The current insertion was found to be 8 mm posterior to the limbus. Each pole suture was passed into sclera at a measured distance of 7.5 mm posterior to the limbus, using direct scleral passes in crossed swords fashion. With the muscle drawn up to the level of the original insertion the two pole sutures were tied together 10 cm above sclera.  The pole sutures were joined with a needle driver at a measured distance of 10 mm above sclera.  The pole sutures were then joined with a 6-0 Vicryl noose suture.   The muscle was allowed to hang back until the noose knot reached sclera, effecting a 10 mm hangback recession of the lateral rectus muscle.  The superior corner of the conjunctival flap was secured to adjacent conjunctiva in the superotemporal quadrant, with a scleral anchoring bite, 5 mm posterior to the limbus.  The inferior corner of the flap was joined to adjacent scleral with a loose loop of 6-0 plain gut, leaving the flap open to facilitate suture adjustment.     Through an inferonasal fornix incision through conjunctiva and Tenon's fascia, the right medial rectus muscle was engaged on a series of muscle hooks and carefully cleared of its fascial attachments and scar tissue to at least 15 mm posterior to the insertion, which was found to be 8 mm posterior to the limbus. The muscle was spread between 2 self-retaining hooks. A 2 mm bite was taken of the center of the muscle belly, at a measured distance of 5.0 mm posterior and a knot was tied securely at this location. The needle at each end of the double-armed suture was passed from the center of the muscle belly to the periphery, parallel to and 5.0 mm posterior to the insertion. A resection clamp was placed on the muscle just anterior to the sutures. The muscle was disinserted. Each pole suture was passed posteriorly to anteriorly through the corresponding end of the original muscle stump, 5 mm posterior to the limbus, then anteriorly to posteriorly near the center of the stump, then posteriorly to anteriorly through the center  of the muscle belly, just posterior to the previously placed knot.  The muscle was drawn up to the level of the original insertion, and all slack was removed, effecting a 5 mm resection and a 3 mm advancement of the medial rectus muscle.  The suture ends were tied securely. The resection clamp was removed.  The portion of the muscle anterior to the sutures was carefully excised. Conjunctiva was closed with a single 6-0  Plain gut  suture.  The pole, noose, traction, and conjunctival sutures of the lateral rectus muscle were taped to the upper and lower lids with steri-strips.   Tobradex ophthalmic ointment was placed in the eye. A sterile dressing was placed on the eye. The patient was awakened without difficulty and taken to the recovery room in stable condition, having suffered no intraoperative or immediate postoperative complications. Marland Kitchen  Shara Blazing, MD

## 2014-12-02 NOTE — Transfer of Care (Signed)
Immediate Anesthesia Transfer of Care Note  Patient: Natasha Turner  Procedure(s) Performed: Procedure(s): REPAIR STRABISMUS RIGHT EYE (Right)  Patient Location: PACU  Anesthesia Type:General  Level of Consciousness: awake, alert , oriented and patient cooperative  Airway & Oxygen Therapy: Patient Spontanous Breathing and Patient connected to face mask oxygen  Post-op Assessment: Report given to RN, Post -op Vital signs reviewed and stable and Patient moving all extremities  Post vital signs: Reviewed and stable  Last Vitals:  Filed Vitals:   12/02/14 1208  BP:   Pulse: 101  Temp:   Resp: 15    Complications: No apparent anesthesia complications

## 2014-12-02 NOTE — H&P (View-Only) (Signed)
  Date of examination:  11-10-14  Indication for surgery: to correct misalignment of the eyes and allow some binocularity  Pertinent past medical history:  Past Medical History  Diagnosis Date  . Hypertension   . Duane's syndrome of right eye     Pertinent ocular history:  S/p strabismus surgery x 3 most recently 1972.  Exotropia documented to have been increasing since first seen by me '98.  Marked limitation of adduction of OD noted on first exam by me.  + fissure narrowing in attempted adduction OD, c/w Duane's.    Pertinent family history: No family history on file.  General:  Healthy appearing patient in no distress.    Eyes:    Acuity cc OD 20/20  OS 20/20  External: Within normal limits fissure narrowing OD in adduction  Anterior segment: Within normal limits x healed conj scars OU  Motility:   XT=45 in primary (with RHT 8).  XT 45 in R gaze, 70 in L gaze.  2-3- adduction OD.  1- abduction OD.    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    Plan: 1.  Explore right medial rectus muscle, advance/resect.  2.  Recess right lateral rectus muscle, adjustable.    Lissy Deuser O 

## 2014-12-02 NOTE — Interval H&P Note (Signed)
History and Physical Interval Note:  12/02/2014 10:18 AM  Natasha Turner  has presented today for surgery, with the diagnosis of Exotropia Right Eye  The various methods of treatment have been discussed with the patient and family. After consideration of risks, benefits and other options for treatment, the patient has consented to  Procedure(s): REPAIR STRABISMUS RIGHT EYE (Right) as a surgical intervention .  The patient's history has been reviewed, patient examined, no change in status, stable for surgery.  I have reviewed the patient's chart and labs.  Questions were answered to the patient's satisfaction.     Shara Blazing

## 2014-12-02 NOTE — Anesthesia Procedure Notes (Signed)
Procedure Name: LMA Insertion Date/Time: 12/02/2014 10:45 AM Performed by: Curly Shores Pre-anesthesia Checklist: Patient identified, Emergency Drugs available, Suction available and Patient being monitored Patient Re-evaluated:Patient Re-evaluated prior to inductionOxygen Delivery Method: Circle System Utilized Preoxygenation: Pre-oxygenation with 100% oxygen Intubation Type: IV induction Ventilation: Mask ventilation without difficulty LMA: LMA flexible inserted LMA Size: 3.0 Number of attempts: 1 Airway Equipment and Method: Bite block Placement Confirmation: positive ETCO2 and breath sounds checked- equal and bilateral Tube secured with: Tape Dental Injury: Teeth and Oropharynx as per pre-operative assessment

## 2014-12-02 NOTE — Anesthesia Preprocedure Evaluation (Signed)
Anesthesia Evaluation  Patient identified by MRN, date of birth, ID band Patient awake    Reviewed: Allergy & Precautions, NPO status , Patient's Chart, lab work & pertinent test results  Airway Mallampati: II   Neck ROM: full    Dental   Pulmonary neg pulmonary ROS,  breath sounds clear to auscultation        Cardiovascular hypertension, Rhythm:regular Rate:Normal     Neuro/Psych  Neuromuscular disease    GI/Hepatic   Endo/Other    Renal/GU      Musculoskeletal   Abdominal   Peds  Hematology   Anesthesia Other Findings   Reproductive/Obstetrics                             Anesthesia Physical Anesthesia Plan  ASA: II  Anesthesia Plan: General   Post-op Pain Management:    Induction: Intravenous  Airway Management Planned: LMA  Additional Equipment:   Intra-op Plan:   Post-operative Plan:   Informed Consent: I have reviewed the patients History and Physical, chart, labs and discussed the procedure including the risks, benefits and alternatives for the proposed anesthesia with the patient or authorized representative who has indicated his/her understanding and acceptance.     Plan Discussed with: CRNA, Anesthesiologist and Surgeon  Anesthesia Plan Comments:         Anesthesia Quick Evaluation

## 2014-12-02 NOTE — Discharge Instructions (Signed)
Diet: Clear liquids, advance to soft foods then regular diet as tolerated.  Pain control:   1)  Ibuprofen 600 mg by mouth every 6-8 hours as needed for pain  2)  Percocet 7.5/325 one or two by mouth as needed every 4-6 hours as needed  for pain that is not resolved by ibuprofen              3)  Cold compres/ice pack as desired for comfort  Eye medications:  Tobradex eye ointment 1/2 inch in right eye twice a day for one week  Activity: No swimming for 1 week.  It is OK to let water run over the face and eyes while showering or taking a bath, even during the first week.  No other restriction on exercise or activity.  Call Dr. Roxy Cedar office [phone] with any problems or concerns.     Post Anesthesia Home Care Instructions  Activity: Get plenty of rest for the remainder of the day. A responsible adult should stay with you for 24 hours following the procedure.  For the next 24 hours, DO NOT: -Drive a car -Advertising copywriter -Drink alcoholic beverages -Take any medication unless instructed by your physician -Make any legal decisions or sign important papers.  Meals: Start with liquid foods such as gelatin or soup. Progress to regular foods as tolerated. Avoid greasy, spicy, heavy foods. If nausea and/or vomiting occur, drink only clear liquids until the nausea and/or vomiting subsides. Call your physician if vomiting continues.  Special Instructions/Symptoms: Your throat may feel dry or sore from the anesthesia or the breathing tube placed in your throat during surgery. If this causes discomfort, gargle with warm salt water. The discomfort should disappear within 24 hours.  If you had a scopolamine patch placed behind your ear for the management of post- operative nausea and/or vomiting:  1. The medication in the patch is effective for 72 hours, after which it should be removed.  Wrap patch in a tissue and discard in the trash. Wash hands thoroughly with soap and water. 2. You may  remove the patch earlier than 72 hours if you experience unpleasant side effects which may include dry mouth, dizziness or visual disturbances. 3. Avoid touching the patch. Wash your hands with soap and water after contact with the patch.

## 2014-12-05 ENCOUNTER — Encounter (HOSPITAL_BASED_OUTPATIENT_CLINIC_OR_DEPARTMENT_OTHER): Payer: Self-pay | Admitting: Ophthalmology

## 2015-01-24 ENCOUNTER — Other Ambulatory Visit: Payer: Self-pay

## 2015-01-24 DIAGNOSIS — Z1231 Encounter for screening mammogram for malignant neoplasm of breast: Secondary | ICD-10-CM

## 2015-01-30 ENCOUNTER — Ambulatory Visit
Admission: RE | Admit: 2015-01-30 | Discharge: 2015-01-30 | Disposition: A | Discharge status: Home / Self Care | Payer: BC Managed Care – PPO | Source: Ambulatory Visit

## 2015-01-30 DIAGNOSIS — Z1231 Encounter for screening mammogram for malignant neoplasm of breast: Secondary | ICD-10-CM

## 2015-06-15 IMAGING — MG MM SCREEN MAMMOGRAM BILATERAL
4 series · 4 of 4 positions shown · non-contrast
Comparison: Previous exam(s).

CLINICAL DATA: Screening.

EXAM:
DIGITAL SCREENING BILATERAL MAMMOGRAM WITH CAD

[R CC]
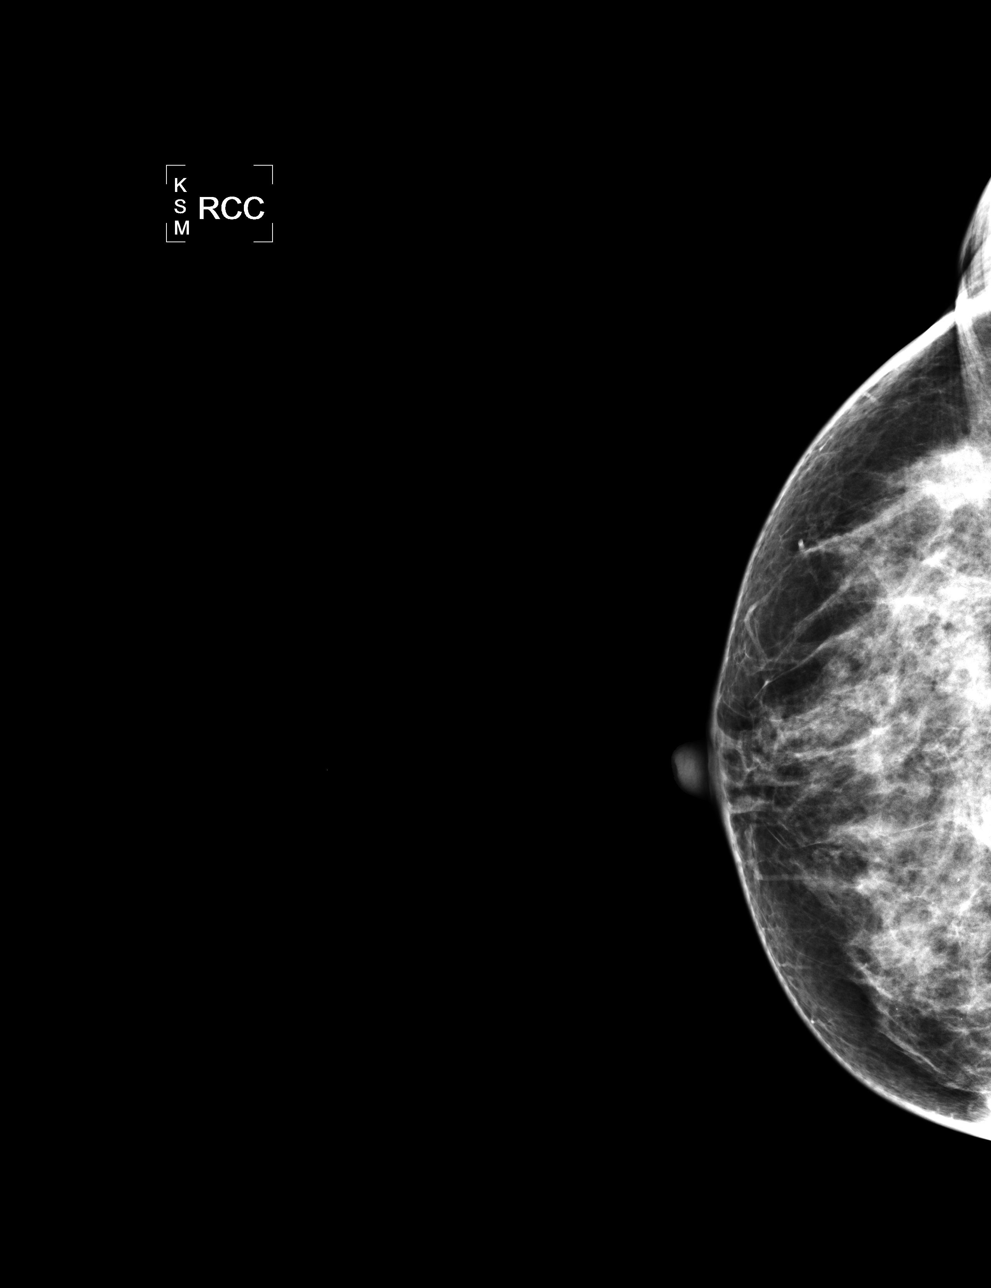

[L CC]
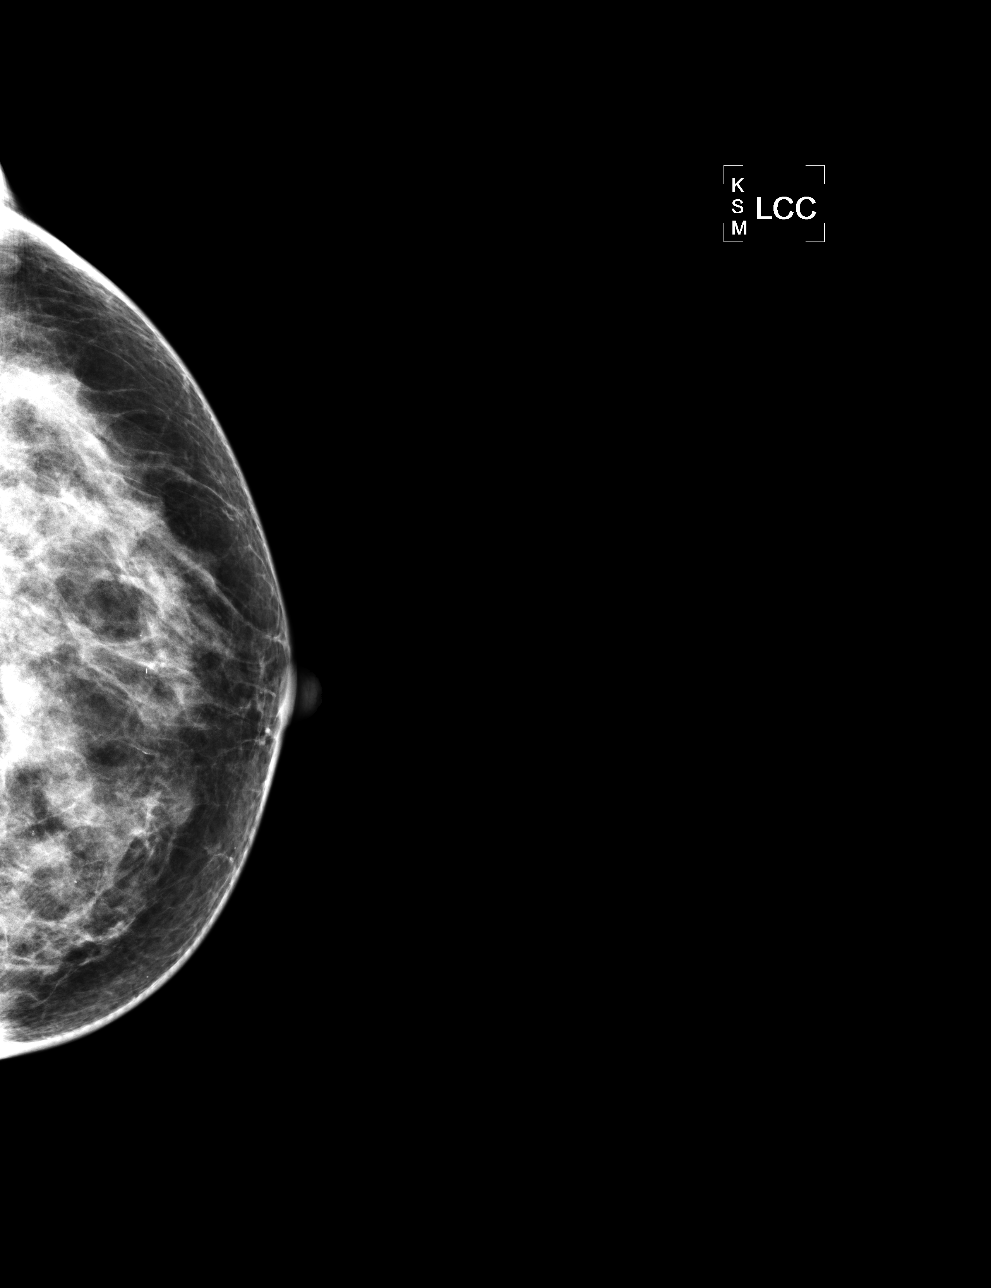

[L MLO]
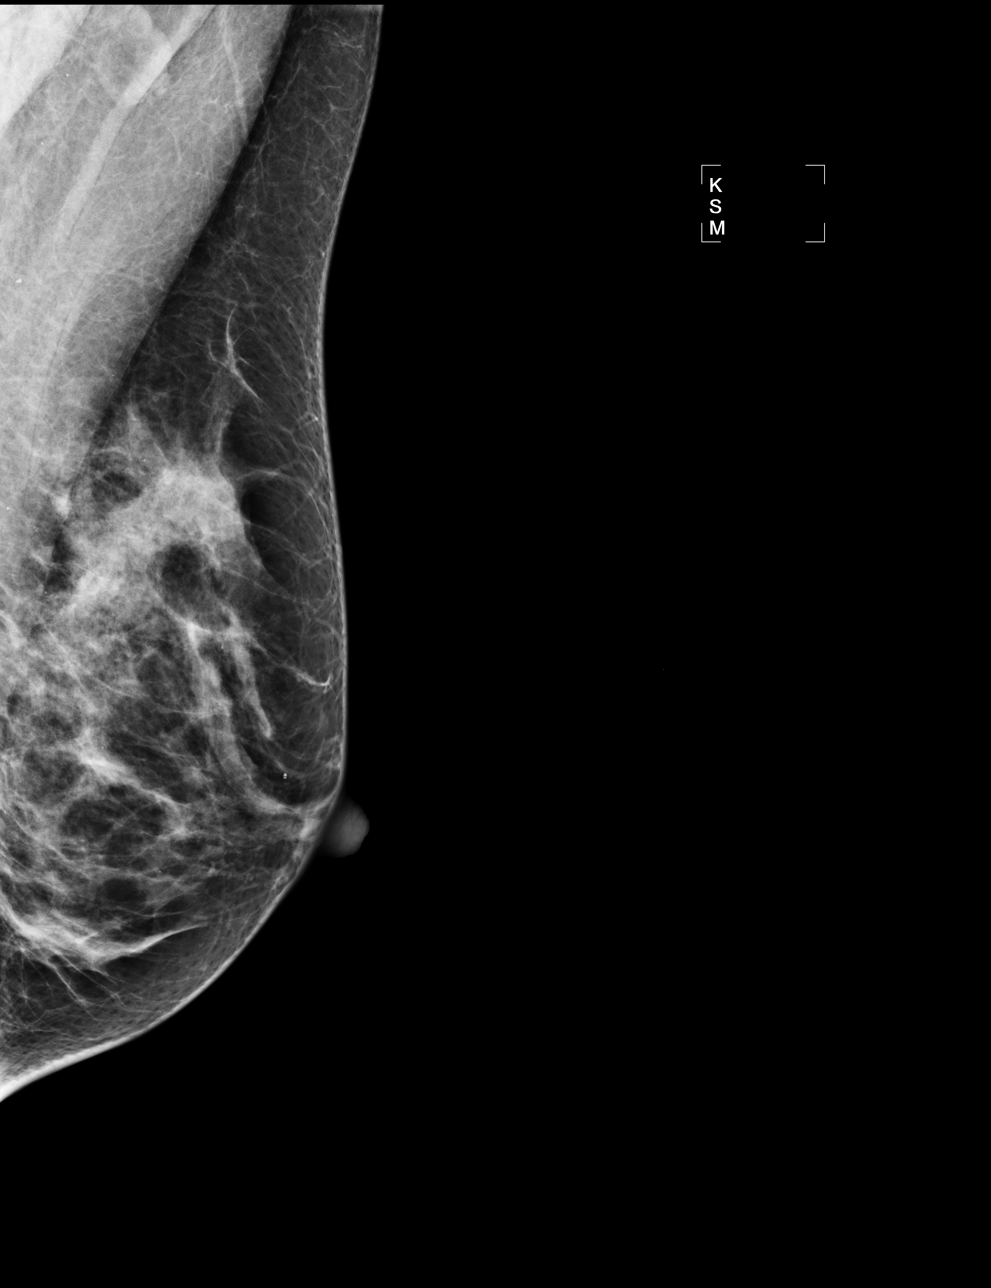

[R MLO]
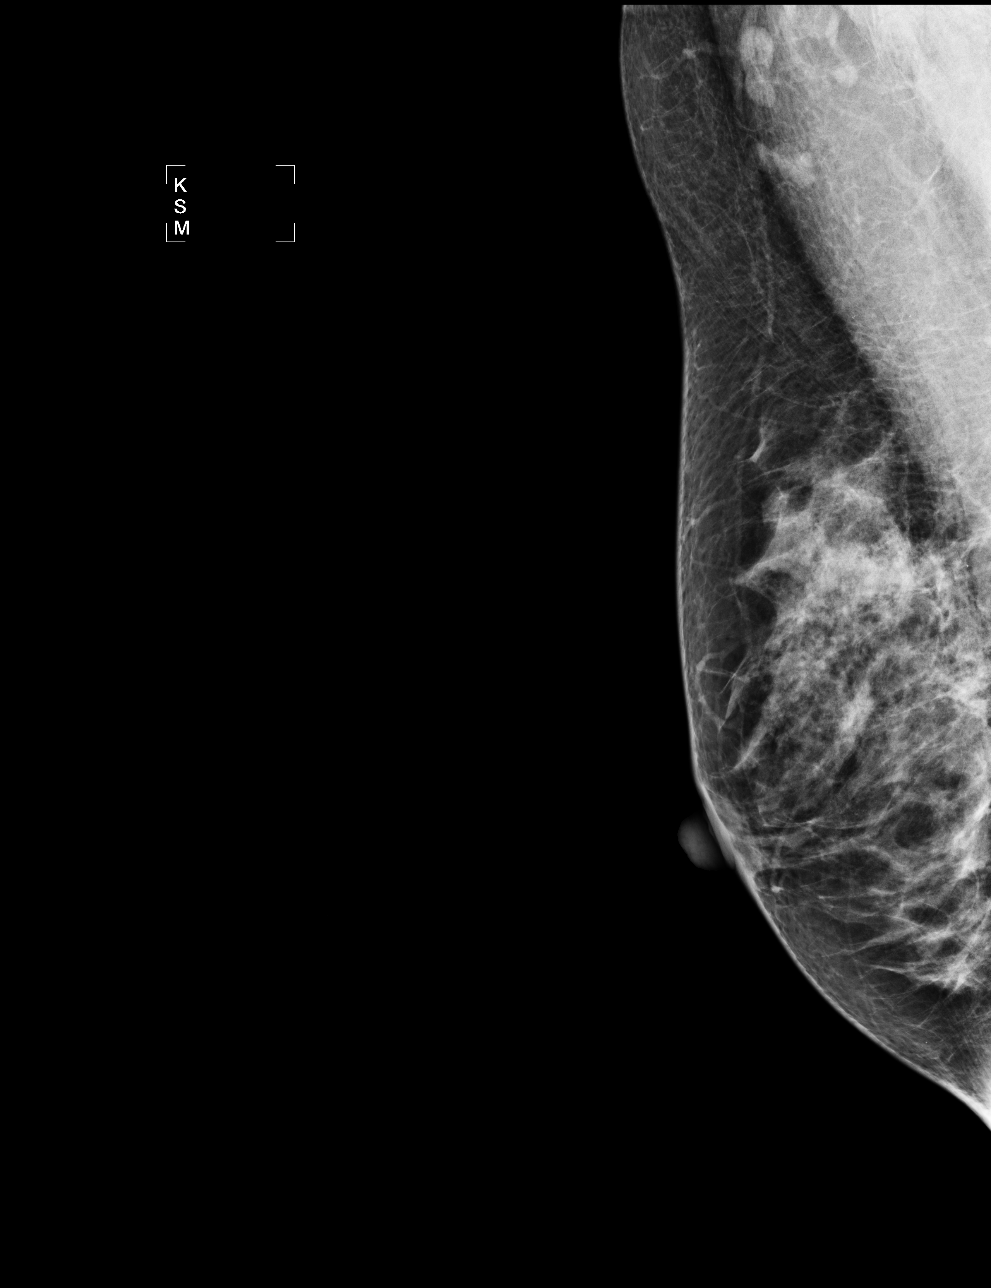

[4 of 4 positions shown; findings below may reference images not displayed]

ACR Breast Density Category c: The breast tissue is heterogeneously
dense, which may obscure small masses.
FINDINGS: There are no findings suspicious for malignancy. Images were
processed with CAD.
IMPRESSION: No mammographic evidence of malignancy. A result letter of this
screening mammogram will be mailed directly to the patient.

RECOMMENDATION:
Screening mammogram in one year. (Code:[id number])

BI-RADS CATEGORY  1: Negative.

## 2015-07-20 MED FILL — LISINOPRIL 2.5 MG TABLET: 2.5 | 90 days supply | Qty: 90 | Fill #0

## 2015-10-30 MED FILL — HYDROCODON-APAP 7.5-325: 7.5-325 | 3 days supply | Qty: 10 | Fill #0

## 2015-10-30 MED FILL — LISINOPRIL 2.5 MG TABLET: 2.5 | 90 days supply | Qty: 90 | Fill #1

## 2015-10-30 MED FILL — NAPROXEN 500 MG TABLET: 500 | 4 days supply | Qty: 12 | Fill #0

## 2015-10-30 MED FILL — CLINDAMYCIN HCL 150 MG CAP: 150 | 7 days supply | Qty: 28 | Fill #0

## 2016-01-31 MED FILL — LISINOPRIL 2.5 MG TABLET: 2.5 | 90 days supply | Qty: 90 | Fill #0

## 2016-01-31 MED FILL — ESTRACE 0.01% CREAM: 0.1 | 90 days supply | Qty: 43 | Fill #0

## 2016-02-12 ENCOUNTER — Other Ambulatory Visit: Payer: Self-pay | Admitting: Family Medicine

## 2016-02-12 DIAGNOSIS — Z1231 Encounter for screening mammogram for malignant neoplasm of breast: Secondary | ICD-10-CM

## 2016-02-26 ENCOUNTER — Ambulatory Visit
Admission: RE | Admit: 2016-02-26 | Discharge: 2016-02-26 | Disposition: A | Discharge status: Home / Self Care | Payer: BC Managed Care – PPO | Source: Ambulatory Visit | Attending: Family Medicine | Admitting: Family Medicine

## 2016-02-26 DIAGNOSIS — Z1231 Encounter for screening mammogram for malignant neoplasm of breast: Secondary | ICD-10-CM

## 2016-05-31 MED FILL — LISINOPRIL 2.5 MG TABLET: 2.5 | 90 days supply | Qty: 90 | Fill #1

## 2016-07-08 MED FILL — ESTRADIOL 0.1 MG/GM CRM: 0.1 | 90 days supply | Qty: 43 | Fill #1

## 2016-09-02 MED FILL — LISINOPRIL 2.5 MG TABLET: 2.5 | 90 days supply | Qty: 90 | Fill #2

## 2016-12-11 MED FILL — LISINOPRIL 2.5 MG TABLET: 2.5 | 90 days supply | Qty: 90 | Fill #3

## 2017-02-04 ENCOUNTER — Other Ambulatory Visit: Payer: Self-pay | Admitting: Family Medicine

## 2017-02-04 ENCOUNTER — Other Ambulatory Visit (HOSPITAL_COMMUNITY)
Admission: RE | Admit: 2017-02-04 | Discharge: 2017-02-04 | Disposition: A | Discharge status: Home / Self Care | Payer: BC Managed Care – PPO | Source: Ambulatory Visit | Attending: Family Medicine | Admitting: Family Medicine

## 2017-02-04 DIAGNOSIS — Z124 Encounter for screening for malignant neoplasm of cervix: Secondary | ICD-10-CM | POA: Diagnosis not present

## 2017-02-05 LAB — CYTOLOGY - PAP: DIAGNOSIS: NEGATIVE

## 2017-03-13 ENCOUNTER — Other Ambulatory Visit: Payer: Self-pay | Admitting: Family Medicine

## 2017-03-13 DIAGNOSIS — Z1231 Encounter for screening mammogram for malignant neoplasm of breast: Secondary | ICD-10-CM

## 2017-03-28 MED FILL — LISINOPRIL 2.5 MG TABLET: 2.5 | 90 days supply | Qty: 90 | Fill #0

## 2017-04-08 ENCOUNTER — Ambulatory Visit
Admission: RE | Admit: 2017-04-08 | Discharge: 2017-04-08 | Disposition: A | Discharge status: Home / Self Care | Payer: BC Managed Care – PPO | Source: Ambulatory Visit | Attending: Family Medicine | Admitting: Family Medicine

## 2017-04-08 DIAGNOSIS — Z1231 Encounter for screening mammogram for malignant neoplasm of breast: Secondary | ICD-10-CM

## 2017-07-16 MED FILL — LISINOPRIL 2.5 MG TABLET: 2.5 | 90 days supply | Qty: 90 | Fill #1

## 2017-11-03 MED FILL — LISINOPRIL 2.5 MG TABLET: 2.5 | 90 days supply | Qty: 90 | Fill #2

## 2017-11-03 MED FILL — ESTRADIOL 0.1 MG/GM CRM: 0.1 | 90 days supply | Qty: 43 | Fill #0

## 2018-02-09 MED FILL — TRIAMCINOLONE 0.1% CREAM: 0.1 | 15 days supply | Qty: 30 | Fill #0

## 2018-02-09 MED FILL — LISINOPRIL 2.5 MG TABLET: 2.5 | 90 days supply | Qty: 90 | Fill #0

## 2018-03-25 ENCOUNTER — Other Ambulatory Visit: Payer: Self-pay | Admitting: Family Medicine

## 2018-03-25 DIAGNOSIS — Z1231 Encounter for screening mammogram for malignant neoplasm of breast: Secondary | ICD-10-CM

## 2018-04-15 ENCOUNTER — Ambulatory Visit
Admission: RE | Admit: 2018-04-15 | Discharge: 2018-04-15 | Disposition: A | Discharge status: Home / Self Care | Payer: BC Managed Care – PPO | Source: Ambulatory Visit | Attending: Family Medicine | Admitting: Family Medicine

## 2018-04-15 DIAGNOSIS — Z1231 Encounter for screening mammogram for malignant neoplasm of breast: Secondary | ICD-10-CM

## 2018-07-08 MED FILL — LISINOPRIL 2.5 MG TABLET: 2.5 | 90 days supply | Qty: 90 | Fill #1

## 2018-11-03 MED FILL — LISINOPRIL 2.5 MG TABLET: 2.5 | 90 days supply | Qty: 90 | Fill #2

## 2019-02-24 MED FILL — ESTRADIOL 0.1 MG/GM CRM: 0.1 | 90 days supply | Qty: 43 | Fill #0

## 2019-02-24 MED FILL — LISINOPRIL 2.5 MG TABLET: 2.5 | 90 days supply | Qty: 90 | Fill #0

## 2019-02-24 MED FILL — TRIAMCINOLONE 0.1% CREAM: 0.1 | 14 days supply | Qty: 30 | Fill #0

## 2019-04-19 ENCOUNTER — Ambulatory Visit: Payer: BC Managed Care – PPO

## 2019-04-19 ENCOUNTER — Other Ambulatory Visit: Payer: Self-pay

## 2019-04-19 ENCOUNTER — Other Ambulatory Visit: Payer: Self-pay | Admitting: Family Medicine

## 2019-04-19 ENCOUNTER — Ambulatory Visit
Admission: RE | Admit: 2019-04-19 | Discharge: 2019-04-19 | Disposition: A | Discharge status: Home / Self Care | Payer: BC Managed Care – PPO | Source: Ambulatory Visit | Attending: Family Medicine | Admitting: Family Medicine

## 2019-04-19 DIAGNOSIS — Z1231 Encounter for screening mammogram for malignant neoplasm of breast: Secondary | ICD-10-CM

## 2019-07-06 ENCOUNTER — Other Ambulatory Visit (HOSPITAL_COMMUNITY)
Admission: RE | Admit: 2019-07-06 | Discharge: 2019-07-06 | Disposition: A | Discharge status: Home / Self Care | Payer: BC Managed Care – PPO | Source: Ambulatory Visit | Attending: Family Medicine | Admitting: Family Medicine

## 2019-07-06 DIAGNOSIS — Z13220 Encounter for screening for lipoid disorders: Secondary | ICD-10-CM | POA: Diagnosis not present

## 2019-07-06 DIAGNOSIS — Z0000 Encounter for general adult medical examination without abnormal findings: Secondary | ICD-10-CM | POA: Diagnosis not present

## 2019-07-06 LAB — CBC
HCT: 42.9 % (ref 36.0–46.0)
Hemoglobin: 14.1 g/dL (ref 12.0–15.0)
MCH: 28.7 pg (ref 26.0–34.0)
MCHC: 32.9 g/dL (ref 30.0–36.0)
MCV: 87.4 fL (ref 80.0–100.0)
Platelets: 353 10*3/uL (ref 150–400)
RBC: 4.91 MIL/uL (ref 3.87–5.11)
RDW: 12.7 % (ref 11.5–15.5)
WBC: 5.5 10*3/uL (ref 4.0–10.5)
nRBC: 0 % (ref 0.0–0.2)

## 2019-07-06 LAB — LIPID PANEL
Cholesterol: 263 mg/dL — ABNORMAL HIGH (ref 0–200)
HDL: 82 mg/dL (ref >40)
LDL Cholesterol: 170 mg/dL — ABNORMAL HIGH (ref 0–99)
Total CHOL/HDL Ratio: 3.2 RATIO
Triglycerides: 56 mg/dL (ref <150)
VLDL: 11 mg/dL (ref 0–40)

## 2019-07-06 LAB — COMPREHENSIVE METABOLIC PANEL
ALT: 15 U/L (ref 0–44)
AST: 22 U/L (ref 15–41)
Albumin: 4.4 g/dL (ref 3.5–5.0)
Alkaline Phosphatase: 41 U/L (ref 38–126)
Anion gap: 7 (ref 5–15)
BUN: 8 mg/dL (ref 8–23)
CO2: 27 mmol/L (ref 22–32)
Calcium: 10.1 mg/dL (ref 8.9–10.3)
Chloride: 103 mmol/L (ref 98–111)
Creatinine, Ser: 0.93 mg/dL (ref 0.44–1.00)
GFR calc Af Amer: >60 mL/min (ref >60)
GFR calc non Af Amer: >60 mL/min (ref >60)
Glucose, Bld: 98 mg/dL (ref 70–99)
Potassium: 3.9 mmol/L (ref 3.5–5.1)
Sodium: 137 mmol/L (ref 135–145)
Total Bilirubin: 0.6 mg/dL (ref 0.3–1.2)
Total Protein: 6.9 g/dL (ref 6.5–8.1)

## 2019-08-30 MED FILL — LISINOPRIL 2.5 MG TABLET: 2.5 | 90 days supply | Qty: 90 | Fill #1

## 2020-01-07 MED FILL — LISINOPRIL 2.5 MG TABLET: 2.5 | 90 days supply | Qty: 90 | Fill #2

## 2020-05-18 ENCOUNTER — Other Ambulatory Visit (HOSPITAL_COMMUNITY)
Admission: RE | Admit: 2020-05-18 | Discharge: 2020-05-18 | Disposition: A | Discharge status: Home / Self Care | Payer: BC Managed Care – PPO | Source: Ambulatory Visit | Attending: Family Medicine | Admitting: Family Medicine

## 2020-05-18 ENCOUNTER — Other Ambulatory Visit: Payer: Self-pay | Admitting: Family Medicine

## 2020-05-18 DIAGNOSIS — Z124 Encounter for screening for malignant neoplasm of cervix: Secondary | ICD-10-CM | POA: Insufficient documentation

## 2020-05-20 ENCOUNTER — Other Ambulatory Visit (HOSPITAL_COMMUNITY): Payer: Self-pay | Admitting: Family Medicine

## 2020-05-22 ENCOUNTER — Other Ambulatory Visit: Payer: Self-pay | Admitting: Family Medicine

## 2020-05-22 DIAGNOSIS — Z1231 Encounter for screening mammogram for malignant neoplasm of breast: Secondary | ICD-10-CM

## 2020-05-22 MED FILL — LISINOPRIL 2.5 MG TABLET: 2.5 | 90 days supply | Qty: 90 | Fill #0

## 2020-05-22 MED FILL — ESTRADIOL 0.1 MG/GM CREA: 0.1 | 90 days supply | Qty: 43 | Fill #0

## 2020-05-23 LAB — CYTOLOGY - PAP: Diagnosis: NEGATIVE

## 2020-07-03 ENCOUNTER — Ambulatory Visit
Admission: RE | Admit: 2020-07-03 | Discharge: 2020-07-03 | Disposition: A | Discharge status: Home / Self Care | Payer: BC Managed Care – PPO | Source: Ambulatory Visit | Attending: Family Medicine | Admitting: Family Medicine

## 2020-07-03 ENCOUNTER — Other Ambulatory Visit: Payer: Self-pay

## 2020-07-03 DIAGNOSIS — Z1231 Encounter for screening mammogram for malignant neoplasm of breast: Secondary | ICD-10-CM

## 2020-09-08 ENCOUNTER — Other Ambulatory Visit (HOSPITAL_COMMUNITY): Payer: Self-pay

## 2020-09-08 MED FILL — Lisinopril Tab 2.5 MG: ORAL | 90 days supply | Qty: 90 | Fill #0 | Status: AC

## 2020-12-18 ENCOUNTER — Other Ambulatory Visit (HOSPITAL_COMMUNITY): Payer: Self-pay

## 2020-12-18 MED FILL — Lisinopril Tab 2.5 MG: ORAL | 90 days supply | Qty: 90 | Fill #1 | Status: AC

## 2021-02-08 ENCOUNTER — Other Ambulatory Visit (HOSPITAL_BASED_OUTPATIENT_CLINIC_OR_DEPARTMENT_OTHER): Payer: Self-pay

## 2021-02-08 ENCOUNTER — Ambulatory Visit: Payer: BC Managed Care – PPO | Attending: Internal Medicine

## 2021-02-08 DIAGNOSIS — Z23 Encounter for immunization: Secondary | ICD-10-CM

## 2021-02-08 MED ORDER — PFIZER COVID-19 VAC BIVALENT 30 MCG/0.3ML IM SUSP
INTRAMUSCULAR | 0 refills | Status: AC
  Filled 2021-02-08: qty 0.3, 1d supply, fill #0

## 2021-02-08 NOTE — Progress Notes (Signed)
   Covid-19 Vaccination Clinic  Name:  SHELBY PELTZ    MRN: [id number] DOB: 25-Nov-1955  02/08/2021  Ms. Polson was observed post Covid-19 immunization for 15 minutes without incident. She was provided with Vaccine Information Sheet and instruction to access the V-Safe system.   Ms. Tomasetti was instructed to call [phone] with any severe reactions post vaccine: Difficulty breathing  Swelling of face and throat  A fast heartbeat  A bad rash all over body  Dizziness and weakness

## 2021-04-27 ENCOUNTER — Other Ambulatory Visit (HOSPITAL_COMMUNITY): Payer: Self-pay

## 2021-04-27 MED FILL — Lisinopril Tab 2.5 MG: ORAL | 90 days supply | Qty: 90 | Fill #2 | Status: AC

## 2021-05-22 ENCOUNTER — Other Ambulatory Visit: Payer: Self-pay | Admitting: Family Medicine

## 2021-05-22 DIAGNOSIS — Z1231 Encounter for screening mammogram for malignant neoplasm of breast: Secondary | ICD-10-CM

## 2021-07-05 ENCOUNTER — Ambulatory Visit
Admission: RE | Admit: 2021-07-05 | Discharge: 2021-07-05 | Disposition: A | Discharge status: Home / Self Care | Payer: BC Managed Care – PPO | Source: Ambulatory Visit

## 2021-07-05 DIAGNOSIS — Z1231 Encounter for screening mammogram for malignant neoplasm of breast: Secondary | ICD-10-CM

## 2021-08-01 ENCOUNTER — Other Ambulatory Visit (HOSPITAL_COMMUNITY): Payer: Self-pay

## 2021-08-02 ENCOUNTER — Other Ambulatory Visit (HOSPITAL_COMMUNITY): Payer: Self-pay

## 2021-08-02 MED ORDER — LISINOPRIL 2.5 MG PO TABS
2.5000 mg | ORAL_TABLET | Freq: Every day | ORAL | 4 refills | Status: AC
  Filled 2021-08-02: qty 90, 90d supply, fill #0
  Filled 2021-10-26: qty 90, 90d supply, fill #1
  Filled 2022-02-18: qty 90, 90d supply, fill #2
  Filled 2022-05-21: qty 90, 90d supply, fill #3

## 2021-08-08 ENCOUNTER — Encounter: Payer: Self-pay | Admitting: Family Medicine

## 2021-08-08 ENCOUNTER — Ambulatory Visit: Payer: BC Managed Care – PPO | Admitting: Family Medicine

## 2021-08-08 ENCOUNTER — Other Ambulatory Visit (HOSPITAL_COMMUNITY): Payer: Self-pay

## 2021-08-08 VITALS — BP 126/84 | Ht 62.0 in | Wt 110.0 lb

## 2021-08-08 DIAGNOSIS — M25552 Pain in left hip: Secondary | ICD-10-CM | POA: Diagnosis not present

## 2021-08-08 DIAGNOSIS — M180 Bilateral primary osteoarthritis of first carpometacarpal joints: Secondary | ICD-10-CM | POA: Diagnosis not present

## 2021-08-08 MED ORDER — NITROGLYCERIN 0.2 MG/HR TD PT24
MEDICATED_PATCH | TRANSDERMAL | 1 refills | Status: AC
  Filled 2021-08-08: qty 7, 28d supply, fill #0
  Filled 2021-10-26: qty 7, 28d supply, fill #1
  Filled 2021-11-30: qty 7, 28d supply, fill #2
  Filled 2022-01-11: qty 7, 28d supply, fill #3
  Filled 2022-02-18: qty 7, 28d supply, fill #4
  Filled 2022-05-21: qty 7, 28d supply, fill #5

## 2021-08-08 NOTE — Patient Instructions (Signed)
It was good to see you! 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 ?Ok to continue activities as tolerated including your Peloton. ?Ice over area of pain 3-4 times a day for 15 minutes at a time if needed. ?Do home exercises and stretches daily as directed for next 6 weeks at least. ?Tylenol and/or aleve as needed for pain. ?Nitro patches 1/4th patch to affected hip, change daily. ?If not improving, can consider physical therapy and/or steroid injection. ?Follow up with me in 6 weeks. ? 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 ?These are the different medications you can take for this: ?Tylenol 500mg  1-2 tabs three times a day for pain. ?Capsaicin, aspercreme, voltaren gel, or biofreeze topically up to four times a day may also help with pain. ?Some supplements that may help for arthritis: Boswellia extract, curcumin, pycnogenol ?Aleve as needed. ?Cortisone injections are an option if pain is severe. ?Heat or ice 15 minutes at a time 3-4 times a day as needed to help with pain. ? ? ? ?

## 2021-08-08 NOTE — Progress Notes (Signed)
PCP: Merri Brunette, MD ? ?Subjective:  ? ?HPI: ?Patient is a 66 y.o. female here for lateral upper left leg tightness and pain. Patient reports insidious onset of symptoms since December. She gets tightness and pain over the lateral upper leg about 2 inches distal to the greater trochanter. She is an avid Building control surveyor user and describes her leg tightening up later in the day after she has cycled that morning. Has tried stretching and notes that a magnesium rub seems to help. The area aches at night. Patient denies any known trauma, bruising, or swelling. ? ?Patient also has bilateral joint swelling and pain at her CMC joints.  ? ?Past Medical History:  ?Diagnosis Date  ? Duane's syndrome of right eye   ? Hypertension   ? ? ?Current Outpatient Medications on File Prior to Visit  ?Medication Sig Dispense Refill  ? cholecalciferol (VITAMIN D) 1000 UNITS tablet Take 2,000 Units by mouth daily.      ? COVID-19 mRNA bivalent vaccine, Pfizer, (PFIZER COVID-19 VAC BIVALENT) injection Inject into the muscle. 0.3 mL 0  ? fish oil-omega-3 fatty acids 1000 MG capsule Take 2,400 mg by mouth daily.      ? lisinopril (ZESTRIL) 2.5 MG tablet TAKE 1 TABLET BY MOUTH ONCE A DAY 90 tablet 3  ? lisinopril (ZESTRIL) 2.5 MG tablet Take 1 tablet (2.5 mg total) by mouth daily. 90 tablet 4  ? oxyCODONE-acetaminophen (PERCOCET) 7.5-325 MG per tablet Take 1 tablet by mouth every 4 (four) hours as needed. 10 tablet 0  ? tobramycin-dexamethasone (TOBRADEX) ophthalmic ointment Place 1 application into the right eye 2 (two) times daily. 3.5 g 0  ? ?No current facility-administered medications on file prior to visit.  ? ? ?Past Surgical History:  ?Procedure Laterality Date  ? EYE SURGERY    ? strabismus x3  ? STRABISMUS SURGERY Right 12/02/2014  ? Procedure: REPAIR STRABISMUS RIGHT EYE;  Surgeon: Verne Carrow, MD;  Location: [facility];  Service: Ophthalmology;  Laterality: Right;  ? ? ?Allergies  ?Allergen Reactions  ? Penicillins    ? ? ?BP 126/84   Ht 5\' 2"  (1.575 m)   Wt 110 lb (49.9 kg)   BMI 20.12 kg/m?  ? ?   ? View : No data to display.  ?  ?  ?  ? ? ?   ? View : No data to display.  ?  ?  ?  ? ? ?    ?Objective:  ?Physical Exam: ? ?Gen: NAD, comfortable in exam room ?CV: Regular rate, well perfused ?Resp: No increased work of breathing, coughing or wheezing ?Psych: Normal mood and affect.  ?MSK: No obvious deformities or swelling of left leg. TTP of left proximal IT band. Some TTP over greater trochanter gluteal insertion. Full ROM and 5/5 strength in knee flexion and extension, hip abduction. Negative logroll. limited but no pain. Ober's is tight.  ?Enlarged CMC joints bilaterally with tenderness to palpation. ? ?  ?Assessment & Plan:  ?1. Left lateral upper leg pain - The most likely source of patients pain is a tight left IT band along with some gluteal tendinopathy. Recommended IT band stretches, gluteal strengthening and nitroglycerin patch use at site of gluteal insertion.  ? ?2. 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 Confirmed via point of care ultrasound. Recommended voltaren gel.  ? ? ?Pearlean Brownie ?MS4, [facility] ? ? ?

## 2021-10-26 ENCOUNTER — Other Ambulatory Visit (HOSPITAL_COMMUNITY): Payer: Self-pay

## 2021-11-05 ENCOUNTER — Ambulatory Visit: Payer: BC Managed Care – PPO | Attending: Internal Medicine

## 2021-11-05 DIAGNOSIS — Z23 Encounter for immunization: Secondary | ICD-10-CM

## 2021-11-06 ENCOUNTER — Other Ambulatory Visit (HOSPITAL_BASED_OUTPATIENT_CLINIC_OR_DEPARTMENT_OTHER): Payer: Self-pay

## 2021-11-06 MED ORDER — PFIZER COVID-19 VAC BIVALENT 30 MCG/0.3ML IM SUSP
INTRAMUSCULAR | 0 refills | Status: AC
  Filled 2021-11-06: qty 0.3, 1d supply, fill #0

## 2021-11-09 ENCOUNTER — Other Ambulatory Visit (HOSPITAL_COMMUNITY): Payer: Self-pay

## 2021-11-30 ENCOUNTER — Other Ambulatory Visit (HOSPITAL_COMMUNITY): Payer: Self-pay

## 2022-01-11 ENCOUNTER — Other Ambulatory Visit (HOSPITAL_COMMUNITY): Payer: Self-pay

## 2022-02-08 ENCOUNTER — Other Ambulatory Visit (HOSPITAL_BASED_OUTPATIENT_CLINIC_OR_DEPARTMENT_OTHER): Payer: Self-pay

## 2022-02-08 MED ORDER — FLUAD QUADRIVALENT 0.5 ML IM PRSY
PREFILLED_SYRINGE | INTRAMUSCULAR | 0 refills | Status: AC
  Filled 2022-02-08: qty 0.5, 1d supply, fill #0

## 2022-02-18 ENCOUNTER — Other Ambulatory Visit (HOSPITAL_COMMUNITY): Payer: Self-pay

## 2022-02-20 ENCOUNTER — Other Ambulatory Visit (HOSPITAL_BASED_OUTPATIENT_CLINIC_OR_DEPARTMENT_OTHER): Payer: Self-pay

## 2022-02-20 MED ORDER — COVID-19 MRNA 2023-2024 VACCINE (COMIRNATY) 0.3 ML INJECTION
INTRAMUSCULAR | 0 refills | Status: AC
  Filled 2022-02-20: qty 0.3, 1d supply, fill #0

## 2022-03-26 ENCOUNTER — Encounter: Payer: Self-pay | Admitting: Sports Medicine

## 2022-03-26 ENCOUNTER — Ambulatory Visit: Payer: Medicare PPO | Admitting: Sports Medicine

## 2022-03-26 VITALS — BP 124/82 | Ht 62.25 in | Wt 105.0 lb

## 2022-03-26 DIAGNOSIS — G8929 Other chronic pain: Secondary | ICD-10-CM

## 2022-03-26 DIAGNOSIS — M25561 Pain in right knee: Secondary | ICD-10-CM | POA: Diagnosis not present

## 2022-03-26 NOTE — Progress Notes (Signed)
CC; RT Knee pain  Patient with history of several years RT knee pain She retired from [employer] recently She has now been exercising about 2 hours a day She has done a lot more walking She is not having the medial knee pain that she has had in the past She does still have some of the anterior knee pain Her last x-rays were about 10 years ago and were unremarkable She did have an ultrasound with a degenerative medial meniscus  She feels the pain is more lateral and occasionally she gets a sharp pain with a twinge while walking on the lateral side but tends to go away quickly  She had a fall onto her lateral hip before this started  Review of systems No locking or giving way No visible swelling  Physical exam Thin very fit looking female in no acute distress BP 124/82   Ht 5' 2.25" (1.581 m)   Wt 105 lb (47.6 kg)   BMI 19.05 kg/m   Knee: Right Normal to inspection with no erythema or effusion or obvious bony abnormalities. Palpation normal with no warmth or joint line tenderness or patellar tenderness or condyle tenderness. ROM normal in flexion and extension and lower leg rotation. Ligaments with solid consistent endpoints including ACL, PCL, LCL, MCL. Negative Mcmurray's and provocative meniscal tests. Non painful patellar compression. Patellar and quadriceps tendons unremarkable. Hamstring and quadriceps strength is normal.  Exam was very normal with the exception of some very mild lateral joint line pain with provocative maneuvers  Hip abduction is weak on the right side but very strong on the left   Ultrasound of the right knee There is a moderate effusion in the suprapatellar pouch The medial meniscus now looks intact The lateral meniscus shows some mild swelling and a small fragment The iliotibial band is normal in appearance but seems to run over a small spur on her lateral patella Trochlear groove shows loss of cartilage at the lateral patellofemoral  joint  Impression: ITB impingement on a small lateral spur; moderate effusion probably from a degenerative lateral meniscus  Ultrasound and interpretation by Sibyl Parr. Darrick Penna, MD

## 2022-03-26 NOTE — Assessment & Plan Note (Signed)
I think we have a good explanation of why the right side got worse and that after the fall onto her hip which caused significant bruising I believe her hip abductors became quite weak  This is placing more lateral pressure with her increased walking  This is probably triggering most of the patellofemoral osteoarthritis symptoms and lead to degenerative change of the lateral meniscus  Home rehabilitation to focus on hip abduction strength Compression Ice Continue some quadriceps exercises Moderate activity not to get too much knee bend Recheck in 6 weeks

## 2022-04-09 ENCOUNTER — Other Ambulatory Visit: Payer: Self-pay | Admitting: Sports Medicine

## 2022-05-24 ENCOUNTER — Other Ambulatory Visit: Payer: Self-pay | Admitting: Family Medicine

## 2022-05-24 DIAGNOSIS — Z1231 Encounter for screening mammogram for malignant neoplasm of breast: Secondary | ICD-10-CM

## 2022-06-11 ENCOUNTER — Other Ambulatory Visit (HOSPITAL_COMMUNITY): Payer: Self-pay

## 2022-06-11 MED ORDER — LISINOPRIL 2.5 MG PO TABS
2.5000 mg | ORAL_TABLET | Freq: Every day | ORAL | 4 refills | Status: DC
  Filled 2022-08-23: qty 90, 90d supply, fill #0
  Filled 2022-11-26: qty 90, 90d supply, fill #1
  Filled 2023-02-25: qty 90, 90d supply, fill #2
  Filled 2023-05-24: qty 90, 90d supply, fill #3

## 2022-06-18 ENCOUNTER — Other Ambulatory Visit: Payer: Self-pay | Admitting: Family Medicine

## 2022-06-18 DIAGNOSIS — E2839 Other primary ovarian failure: Secondary | ICD-10-CM

## 2022-06-18 DIAGNOSIS — M8580 Other specified disorders of bone density and structure, unspecified site: Secondary | ICD-10-CM

## 2022-07-08 ENCOUNTER — Ambulatory Visit
Admission: RE | Admit: 2022-07-08 | Discharge: 2022-07-08 | Disposition: A | Discharge status: Home / Self Care | Payer: Medicare PPO | Source: Ambulatory Visit | Attending: Family Medicine | Admitting: Family Medicine

## 2022-07-08 ENCOUNTER — Other Ambulatory Visit: Payer: Medicare PPO

## 2022-07-08 DIAGNOSIS — Z1231 Encounter for screening mammogram for malignant neoplasm of breast: Secondary | ICD-10-CM

## 2022-08-05 ENCOUNTER — Other Ambulatory Visit: Payer: Medicare PPO

## 2022-08-12 ENCOUNTER — Other Ambulatory Visit (HOSPITAL_COMMUNITY): Payer: Self-pay

## 2022-08-12 DIAGNOSIS — Z85828 Personal history of other malignant neoplasm of skin: Secondary | ICD-10-CM | POA: Diagnosis not present

## 2022-08-12 DIAGNOSIS — L814 Other melanin hyperpigmentation: Secondary | ICD-10-CM | POA: Diagnosis not present

## 2022-08-12 DIAGNOSIS — D2272 Melanocytic nevi of left lower limb, including hip: Secondary | ICD-10-CM | POA: Diagnosis not present

## 2022-08-12 DIAGNOSIS — I781 Nevus, non-neoplastic: Secondary | ICD-10-CM | POA: Diagnosis not present

## 2022-08-12 DIAGNOSIS — D1801 Hemangioma of skin and subcutaneous tissue: Secondary | ICD-10-CM | POA: Diagnosis not present

## 2022-08-12 DIAGNOSIS — D225 Melanocytic nevi of trunk: Secondary | ICD-10-CM | POA: Diagnosis not present

## 2022-08-12 DIAGNOSIS — L309 Dermatitis, unspecified: Secondary | ICD-10-CM | POA: Diagnosis not present

## 2022-08-12 DIAGNOSIS — L821 Other seborrheic keratosis: Secondary | ICD-10-CM | POA: Diagnosis not present

## 2022-08-12 DIAGNOSIS — L578 Other skin changes due to chronic exposure to nonionizing radiation: Secondary | ICD-10-CM | POA: Diagnosis not present

## 2022-08-12 MED ORDER — TRIAMCINOLONE ACETONIDE 0.1 % EX CREA
1.0000 | TOPICAL_CREAM | Freq: Every day | CUTANEOUS | 0 refills | Status: AC
  Filled 2022-08-12: qty 60, 30d supply, fill #0

## 2022-08-23 ENCOUNTER — Other Ambulatory Visit (HOSPITAL_COMMUNITY): Payer: Self-pay

## 2022-11-29 ENCOUNTER — Other Ambulatory Visit (HOSPITAL_COMMUNITY): Payer: Self-pay

## 2022-12-27 ENCOUNTER — Other Ambulatory Visit: Payer: Self-pay | Admitting: Family Medicine

## 2022-12-27 DIAGNOSIS — E2839 Other primary ovarian failure: Secondary | ICD-10-CM

## 2022-12-27 DIAGNOSIS — M8580 Other specified disorders of bone density and structure, unspecified site: Secondary | ICD-10-CM

## 2023-01-03 ENCOUNTER — Ambulatory Visit
Admission: RE | Admit: 2023-01-03 | Discharge: 2023-01-03 | Disposition: A | Discharge status: Home / Self Care | Payer: Medicare PPO | Source: Ambulatory Visit | Attending: Family Medicine | Admitting: Family Medicine

## 2023-01-03 DIAGNOSIS — M8588 Other specified disorders of bone density and structure, other site: Secondary | ICD-10-CM | POA: Diagnosis not present

## 2023-01-03 DIAGNOSIS — E349 Endocrine disorder, unspecified: Secondary | ICD-10-CM | POA: Diagnosis not present

## 2023-01-03 DIAGNOSIS — N958 Other specified menopausal and perimenopausal disorders: Secondary | ICD-10-CM | POA: Diagnosis not present

## 2023-01-03 DIAGNOSIS — M8580 Other specified disorders of bone density and structure, unspecified site: Secondary | ICD-10-CM

## 2023-01-03 DIAGNOSIS — E2839 Other primary ovarian failure: Secondary | ICD-10-CM

## 2023-01-30 ENCOUNTER — Other Ambulatory Visit (HOSPITAL_BASED_OUTPATIENT_CLINIC_OR_DEPARTMENT_OTHER): Payer: Self-pay

## 2023-01-30 MED ORDER — COVID-19 MRNA VAC-TRIS(PFIZER) 30 MCG/0.3ML IM SUSY
0.3000 mL | PREFILLED_SYRINGE | Freq: Once | INTRAMUSCULAR | 0 refills | Status: AC
  Filled 2023-01-30: qty 0.3, 1d supply, fill #0

## 2023-02-19 ENCOUNTER — Other Ambulatory Visit: Payer: Self-pay

## 2023-02-19 ENCOUNTER — Ambulatory Visit: Payer: Medicare PPO | Admitting: Sports Medicine

## 2023-02-19 ENCOUNTER — Encounter: Payer: Self-pay | Admitting: Sports Medicine

## 2023-02-19 ENCOUNTER — Telehealth: Payer: Medicare PPO | Admitting: *Deleted

## 2023-02-19 VITALS — BP 146/81 | Ht 62.0 in | Wt 107.0 lb

## 2023-02-19 DIAGNOSIS — M25561 Pain in right knee: Secondary | ICD-10-CM | POA: Diagnosis not present

## 2023-02-19 DIAGNOSIS — M810 Age-related osteoporosis without current pathological fracture: Secondary | ICD-10-CM

## 2023-02-19 DIAGNOSIS — G8929 Other chronic pain: Secondary | ICD-10-CM | POA: Diagnosis not present

## 2023-02-19 NOTE — Telephone Encounter (Addendum)
Verfied Prolia Benefits via [employer].  Pt ready for scheduling on or after: 02/28/23  Out-of-pocket cost due at time of visit: $40  Primary: [employer] Prolia co-insurance: $0 Admin fee co-insurance: $0  Deductible: $0  Secondary: N/A Prolia co-insurance:  Admin fee co-insurance:   Deductible:   Prior Auth: Approved PA#  [id number] Valid: 03/03/23 - 05/12/24  ** This summary of benefits is an estimation of the patient's out-of-pocket cost. Exact cost may vary based on individual plan coverage.

## 2023-02-19 NOTE — Progress Notes (Signed)
PCP: Merri Brunette, MD  Subjective:   HPI: Patient is a 67 y.o. female here for right knee pain.  Patient states that she feels like she has been having some knee pain that is  specifically around her fibular head area.  Patient is an avid runner, cyclist and does yoga and Pilates.  Patient states that she sometimes feels like the pain radiates down from her fibular head down into her ankle area.  Patient describes it as like a pinching type pain.  Patient denies any numbness.  Patient does note that she broke her fourth and fifth toes many years ago and that whenever she is running she feels like she is compensating with her foot in order to accommodate those toes.  Patient voices no other concerns at this time.   Past Medical History:  Diagnosis Date   Duane's syndrome of right eye    Hypertension     Current Outpatient Medications on File Prior to Visit  Medication Sig Dispense Refill   cholecalciferol (VITAMIN D) 1000 UNITS tablet Take 2,000 Units by mouth daily.       COVID-19 mRNA bivalent vaccine, Pfizer, (PFIZER COVID-19 VAC BIVALENT) injection Inject into the muscle. 0.3 mL 0   COVID-19 mRNA bivalent vaccine, Pfizer, (PFIZER COVID-19 VAC BIVALENT) injection Inject into the muscle. 0.3 mL 0   COVID-19 mRNA vaccine 2023-2024 (COMIRNATY) SUSP injection Inject into the muscle. 0.3 mL 0   fish oil-omega-3 fatty acids 1000 MG capsule Take 2,400 mg by mouth daily.       influenza vaccine adjuvanted (FLUAD QUADRIVALENT) 0.5 ML injection Inject into the muscle. 0.5 mL 0   lisinopril (ZESTRIL) 2.5 MG tablet TAKE 1 TABLET BY MOUTH ONCE A DAY 90 tablet 3   lisinopril (ZESTRIL) 2.5 MG tablet Take 1 tablet (2.5 mg total) by mouth daily. 90 tablet 4   lisinopril (ZESTRIL) 2.5 MG tablet Take 1 tablet (2.5 mg total) by mouth daily. 90 tablet 4   nitroGLYCERIN (NITRODUR - DOSED IN MG/24 HR) 0.2 mg/hr patch Apply 1/4th patch to affected hip, change daily 30 patch 1   oxyCODONE-acetaminophen  (PERCOCET) 7.5-325 MG per tablet Take 1 tablet by mouth every 4 (four) hours as needed. 10 tablet 0   tobramycin-dexamethasone (TOBRADEX) ophthalmic ointment Place 1 application into the right eye 2 (two) times daily. 3.5 g 0   triamcinolone cream (KENALOG) 0.1 % Apply 1 Application topically daily. 60 g 0   No current facility-administered medications on file prior to visit.    Past Surgical History:  Procedure Laterality Date   EYE SURGERY     strabismus x3   STRABISMUS SURGERY Right 12/02/2014   Procedure: REPAIR STRABISMUS RIGHT EYE;  Surgeon: Verne Carrow, MD;  Location: [facility];  Service: Ophthalmology;  Laterality: Right;    Allergies  Allergen Reactions   Penicillins     BP (!) 146/81   Ht 5\' 2"  (1.575 m)   Wt 107 lb (48.5 kg)   BMI 19.57 kg/m       No data to display              No data to display              Objective:  Physical Exam:  Gen: NAD, comfortable in exam room  Knee, Right: Inspection was negative for erythema, ecchymosis, and effusion. No obvious bony abnormalities or signs of osteophyte development. Palpation yielded no asymmetric warmth; Mild medial joint line tenderness; No condyle tenderness; No patellar  tenderness; No knee crepitus. Patellar and quadriceps tendons unremarkable, and no tenderness of the pes anserine bursa. No obvious Baker's cyst development. ROM normal in flexion (135 degrees) and extension (0 degrees). Strength 5/5 with knee flexion and extension. Neurovascularly intact bilaterally.   Provocative Testing:    - Patella:   - Patellar grind/compression: NEG   - Patellar glide: Appropriate medial/lateral glide without apprehension  RT fibular head shows some increase in motion on comparison  Standing shows widening of the forefoot on right with some flattening of lateral column There is slight external rotation of the right knee   U/S Right knee: Superior quadriceps tendon does show some  hypoechoic changes beneath the  tendon, Mild spurring at superior patella, there is some mild fluid noted in the cavity of the suprapatellar pouch.  Proximal patellar tendon shows some slight hypoechoic change and irregularity  Distal patellar tendon appears appropriate without any change, no effusion noted  Medial meniscus appears appropriate without any tears or effusions.  Posterior aspect of the medial meniscus also appears appropriate without any concern.  Lateral meniscus shows some narrowing of the meniscal area with noted spurring off of the femur.  Posterior aspect of the lateral meniscus shows further spurring coming off of the femoral portion as well as calcification in the lateral meniscal space.  Fibular head and surrounding soft tissue without significant changes  Impression: Suprapatellar pouch with some noted fluid, quadriceps tendon slightly hypoechoic consistent with some inflammation at its tendon insertion.  Lateral meniscus shows some narrowing of the meniscal area as well as some spurring off the femur.  Posterior aspect the lateral meniscus showing some calcification likely from a previous injury.  Impression: Lateral right knee pain likely from degenerative meniscus; ultrasound suggestive of prior quadriceps and mild patellar tendinopathy  Ultrasound and interpretation by Dr. Benjiman Core and Sibyl Parr. Fields, MD  Assessment & Plan:  1. 1. Chronic pain of right knee -Patient's symptoms likely related to some structural changes.  Patient does have some lateral meniscal narrowing as well as spurring off the femoral condyle which could be causing issues.  Patient also noted to have some calcification of the posterior aspect of the lateral meniscus, this would be consistent with the locking/catching she feels whenever she flexes her knee.  Patient is also noted to be having compensated external rotation because of a history of toe fractures.  Patient was given a green insole with a  lateral post, patient was able to tolerate ambulation/running afterwards and noted improvement.  Will continue with green sports insole and patient to follow-up if any adjustments need to be made.  Can consider a posterior lateral wedge if patient needs further stability.  Patient was also advised to continue wearing a sleeve over the right knee for added stability.  Will also recommend weightbearing exercises at this time.  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  Given patient's DEXA scan, will do weightbearing exercises and discussed with her the benefits of bisphosphonates.  Patient will trial Prolia at this time.  Will call patient when medication is ready. - [place] LIMITED JOINT SPACE STRUCTURES LOW RIGHT; Future  Osteoporosis   Brenton Grills MD, PGY-4  Sports Medicine Fellow [facility]  I observed and examined the patient with the [facility] resident and agree with assessment and plan.  Note reviewed and modified by me. Sterling Big, MD

## 2023-02-19 NOTE — Patient Instructions (Addendum)
Please continue to wear your knee brace while exercising  Please use your new insoles, let us know how they feel or if you feel like there needs to be any changes made to them  We will call you regarding the Prolia injections, we will let you know as soon as they are ready.  Regarding your weightbearing exercises.  I would recommend you start doing things that are targeting the quadriceps but you should work on all areas/major joints (shoulder, hip)  When it comes to working out, it is important you do at least 9-12 sets a week.  Once that would be 10 reps.  You may do more reps or less depending on the weight you are doing.  The other important thing to recall is that you are going close to technical failure.  Technical failure means you cannot do another rep.  I would recommend anywhere between 2-3 reps from technical failure.  You can also start increasing her protein intake as that will help build muscle.  Follow-up with [place] if you have any other concerns.

## 2023-02-19 NOTE — Assessment & Plan Note (Signed)
There has been some progress of the lateral meniscal degeneration  Will try his in sports insoles but may ultimately need a custom orthotic  Patellar compression  Okay to continue activities

## 2023-02-26 NOTE — Telephone Encounter (Signed)
PA initiated via CoverMyMeds Key: [id number]

## 2023-03-04 ENCOUNTER — Other Ambulatory Visit: Payer: Self-pay | Admitting: *Deleted

## 2023-03-04 DIAGNOSIS — M810 Age-related osteoporosis without current pathological fracture: Secondary | ICD-10-CM

## 2023-03-04 NOTE — Addendum Note (Signed)
Addended by: Annita Brod on: 03/04/2023 03:51 PM   Modules accepted: Orders

## 2023-03-04 NOTE — Telephone Encounter (Signed)
Pt ready for scheduling on or after: 03/04/23  Out-of-pocket cost due at time of visit: $40  Primary: [employer] Prolia co-insurance:  Admin fee co-insurance:   Deductible: does not apply   Prior Auth: Approved PA#  Valid: 03/03/23 to 05/12/24  ** This summary of benefits is an estimation of the patient's out-of-pocket cost. Exact cost may vary based on individual plan coverage.

## 2023-03-06 ENCOUNTER — Ambulatory Visit: Payer: Medicare PPO | Admitting: Sports Medicine

## 2023-03-06 ENCOUNTER — Other Ambulatory Visit (HOSPITAL_COMMUNITY)
Admission: AD | Admit: 2023-03-06 | Discharge: 2023-03-06 | Disposition: A | Discharge status: Home / Self Care | Payer: Medicare PPO | Source: Ambulatory Visit | Attending: Family Medicine | Admitting: Family Medicine

## 2023-03-06 VITALS — Ht 62.0 in

## 2023-03-06 DIAGNOSIS — M810 Age-related osteoporosis without current pathological fracture: Secondary | ICD-10-CM

## 2023-03-06 LAB — BASIC METABOLIC PANEL
Anion gap: 9 (ref 5–15)
BUN: 17 mg/dL (ref 8–23)
CO2: 25 mmol/L (ref 22–32)
Calcium: 10.1 mg/dL (ref 8.9–10.3)
Chloride: 104 mmol/L (ref 98–111)
Creatinine, Ser: 0.83 mg/dL (ref 0.44–1.00)
GFR, Estimated: >60 mL/min (ref >60)
Glucose, Bld: 96 mg/dL (ref 70–99)
Potassium: 3.8 mmol/L (ref 3.5–5.1)
Sodium: 138 mmol/L (ref 135–145)

## 2023-03-06 LAB — VITAMIN D 25 HYDROXY (VIT D DEFICIENCY, FRACTURES): Vit D, 25-Hydroxy: 62.17 ng/mL (ref 30–100)

## 2023-03-06 MED ORDER — DENOSUMAB 60 MG/ML ~~LOC~~ SOSY
60.0000 mg | PREFILLED_SYRINGE | Freq: Once | SUBCUTANEOUS | Status: AC
  Administered 2023-03-06: 60 mg via SUBCUTANEOUS

## 2023-03-06 NOTE — Progress Notes (Signed)
Patient given [place] prolia injection 60mg /ml in the left arm. Patient tolerated injection well without reaction at the injection site. Patient will schedule next injection, which is 6 months from today.

## 2023-03-06 NOTE — Progress Notes (Signed)
Patient for osteoporosis treatment.  Injection given today and supervised by me.  Repeat in 6 mos.  Sterling Big, MD

## 2023-03-08 MED ORDER — DENOSUMAB 60 MG/ML ~~LOC~~ SOSY: 60.0000 mg | PREFILLED_SYRINGE | Freq: Once | SUBCUTANEOUS | Status: AC

## 2023-03-08 NOTE — Addendum Note (Signed)
Addended by: Dierdre Searles on: 03/08/2023 04:07 PM   Modules accepted: Orders

## 2023-05-15 ENCOUNTER — Ambulatory Visit: Payer: Medicare PPO | Admitting: Sports Medicine

## 2023-05-15 VITALS — BP 138/74 | Ht 62.0 in | Wt 106.0 lb

## 2023-05-15 DIAGNOSIS — G8929 Other chronic pain: Secondary | ICD-10-CM | POA: Diagnosis not present

## 2023-05-15 DIAGNOSIS — M25561 Pain in right knee: Secondary | ICD-10-CM | POA: Diagnosis not present

## 2023-05-15 NOTE — Assessment & Plan Note (Signed)
 I believe her pain relates to worsening of degenerative lateral meniscus that we have noted for > than 1 year.  First injury to this area ~ 2015.  She has responded well to PT in the past and we will plan a dedicated course of this over the next 6 wks. (Suggested [facility])  At end of 6 weeks reck and cont with HEP if doing well  If not I would try ESWT for 4 to 6 sessions

## 2023-05-15 NOTE — Progress Notes (Signed)
 CC: RT lateral knee pain  Patient continues to note lateral RT knee pain. This catches intermittently. No giving way but sometimes a sharp pain that may go away after a few steps. She does yoga and runs regularly and feels that the RT leg is weaker. Knee still flexes well. Minimal swelling noted. Topical medicine with menthol (Blue) helps the most but compression sleeve is also helpful.  PE Thin W F in NAD BP 138/74   Ht 5' 2 (1.575 m)   Wt 106 lb (48.1 kg)   BMI 19.39 kg/m   RT knee with no noted effusion She can get 160 degrees of flexion (10 deg <than LT) Full extension now Stable ligaments and no pain on testing Fibular head is stable and not hypermobile  Previous US  showed degenerative lateral meniscus with calcification

## 2023-05-21 DIAGNOSIS — M2351 Chronic instability of knee, right knee: Secondary | ICD-10-CM | POA: Diagnosis not present

## 2023-05-21 DIAGNOSIS — R262 Difficulty in walking, not elsewhere classified: Secondary | ICD-10-CM | POA: Diagnosis not present

## 2023-05-21 DIAGNOSIS — M25561 Pain in right knee: Secondary | ICD-10-CM | POA: Diagnosis not present

## 2023-05-21 DIAGNOSIS — M25661 Stiffness of right knee, not elsewhere classified: Secondary | ICD-10-CM | POA: Diagnosis not present

## 2023-05-26 DIAGNOSIS — M25661 Stiffness of right knee, not elsewhere classified: Secondary | ICD-10-CM | POA: Diagnosis not present

## 2023-05-26 DIAGNOSIS — M25561 Pain in right knee: Secondary | ICD-10-CM | POA: Diagnosis not present

## 2023-05-26 DIAGNOSIS — R262 Difficulty in walking, not elsewhere classified: Secondary | ICD-10-CM | POA: Diagnosis not present

## 2023-05-26 DIAGNOSIS — M2351 Chronic instability of knee, right knee: Secondary | ICD-10-CM | POA: Diagnosis not present

## 2023-06-02 DIAGNOSIS — M2351 Chronic instability of knee, right knee: Secondary | ICD-10-CM | POA: Diagnosis not present

## 2023-06-02 DIAGNOSIS — M25561 Pain in right knee: Secondary | ICD-10-CM | POA: Diagnosis not present

## 2023-06-02 DIAGNOSIS — M25661 Stiffness of right knee, not elsewhere classified: Secondary | ICD-10-CM | POA: Diagnosis not present

## 2023-06-02 DIAGNOSIS — R262 Difficulty in walking, not elsewhere classified: Secondary | ICD-10-CM | POA: Diagnosis not present

## 2023-06-05 DIAGNOSIS — R262 Difficulty in walking, not elsewhere classified: Secondary | ICD-10-CM | POA: Diagnosis not present

## 2023-06-05 DIAGNOSIS — M25561 Pain in right knee: Secondary | ICD-10-CM | POA: Diagnosis not present

## 2023-06-05 DIAGNOSIS — M2351 Chronic instability of knee, right knee: Secondary | ICD-10-CM | POA: Diagnosis not present

## 2023-06-05 DIAGNOSIS — M25661 Stiffness of right knee, not elsewhere classified: Secondary | ICD-10-CM | POA: Diagnosis not present

## 2023-06-09 DIAGNOSIS — M2351 Chronic instability of knee, right knee: Secondary | ICD-10-CM | POA: Diagnosis not present

## 2023-06-09 DIAGNOSIS — M25661 Stiffness of right knee, not elsewhere classified: Secondary | ICD-10-CM | POA: Diagnosis not present

## 2023-06-09 DIAGNOSIS — M25561 Pain in right knee: Secondary | ICD-10-CM | POA: Diagnosis not present

## 2023-06-09 DIAGNOSIS — R262 Difficulty in walking, not elsewhere classified: Secondary | ICD-10-CM | POA: Diagnosis not present

## 2023-06-10 ENCOUNTER — Other Ambulatory Visit: Payer: Self-pay | Admitting: Family Medicine

## 2023-06-10 DIAGNOSIS — Z1231 Encounter for screening mammogram for malignant neoplasm of breast: Secondary | ICD-10-CM

## 2023-06-13 DIAGNOSIS — R262 Difficulty in walking, not elsewhere classified: Secondary | ICD-10-CM | POA: Diagnosis not present

## 2023-06-13 DIAGNOSIS — M25561 Pain in right knee: Secondary | ICD-10-CM | POA: Diagnosis not present

## 2023-06-13 DIAGNOSIS — S76111A Strain of right quadriceps muscle, fascia and tendon, initial encounter: Secondary | ICD-10-CM | POA: Diagnosis not present

## 2023-06-16 DIAGNOSIS — M25561 Pain in right knee: Secondary | ICD-10-CM | POA: Diagnosis not present

## 2023-06-16 DIAGNOSIS — Z0000 Encounter for general adult medical examination without abnormal findings: Secondary | ICD-10-CM | POA: Diagnosis not present

## 2023-06-16 DIAGNOSIS — R262 Difficulty in walking, not elsewhere classified: Secondary | ICD-10-CM | POA: Diagnosis not present

## 2023-06-16 DIAGNOSIS — S76111A Strain of right quadriceps muscle, fascia and tendon, initial encounter: Secondary | ICD-10-CM | POA: Diagnosis not present

## 2023-06-23 DIAGNOSIS — S76111A Strain of right quadriceps muscle, fascia and tendon, initial encounter: Secondary | ICD-10-CM | POA: Diagnosis not present

## 2023-06-23 DIAGNOSIS — M25561 Pain in right knee: Secondary | ICD-10-CM | POA: Diagnosis not present

## 2023-06-23 DIAGNOSIS — R262 Difficulty in walking, not elsewhere classified: Secondary | ICD-10-CM | POA: Diagnosis not present

## 2023-06-25 ENCOUNTER — Other Ambulatory Visit (HOSPITAL_COMMUNITY): Payer: Self-pay

## 2023-06-25 ENCOUNTER — Other Ambulatory Visit: Payer: Self-pay | Admitting: Medical Genetics

## 2023-06-25 DIAGNOSIS — Z0000 Encounter for general adult medical examination without abnormal findings: Secondary | ICD-10-CM | POA: Diagnosis not present

## 2023-06-25 DIAGNOSIS — Z23 Encounter for immunization: Secondary | ICD-10-CM | POA: Diagnosis not present

## 2023-06-25 DIAGNOSIS — Z1331 Encounter for screening for depression: Secondary | ICD-10-CM | POA: Diagnosis not present

## 2023-06-25 DIAGNOSIS — R030 Elevated blood-pressure reading, without diagnosis of hypertension: Secondary | ICD-10-CM | POA: Diagnosis not present

## 2023-06-25 MED ORDER — LISINOPRIL 2.5 MG PO TABS
2.5000 mg | ORAL_TABLET | Freq: Every day | ORAL | 4 refills | Status: AC
  Filled 2023-06-25 – 2023-08-25 (×2): qty 90, 90d supply, fill #0
  Filled 2023-11-26: qty 90, 90d supply, fill #1
  Filled 2024-02-20: qty 90, 90d supply, fill #2
  Filled 2024-05-20: qty 90, 90d supply, fill #3

## 2023-06-26 DIAGNOSIS — R262 Difficulty in walking, not elsewhere classified: Secondary | ICD-10-CM | POA: Diagnosis not present

## 2023-06-26 DIAGNOSIS — S76111A Strain of right quadriceps muscle, fascia and tendon, initial encounter: Secondary | ICD-10-CM | POA: Diagnosis not present

## 2023-06-26 DIAGNOSIS — M25561 Pain in right knee: Secondary | ICD-10-CM | POA: Diagnosis not present

## 2023-06-30 DIAGNOSIS — M25561 Pain in right knee: Secondary | ICD-10-CM | POA: Diagnosis not present

## 2023-06-30 DIAGNOSIS — S76111A Strain of right quadriceps muscle, fascia and tendon, initial encounter: Secondary | ICD-10-CM | POA: Diagnosis not present

## 2023-06-30 DIAGNOSIS — R262 Difficulty in walking, not elsewhere classified: Secondary | ICD-10-CM | POA: Diagnosis not present

## 2023-07-02 ENCOUNTER — Other Ambulatory Visit (HOSPITAL_COMMUNITY): Payer: Self-pay

## 2023-07-02 ENCOUNTER — Other Ambulatory Visit (HOSPITAL_COMMUNITY)
Admission: RE | Admit: 2023-07-02 | Discharge: 2023-07-02 | Disposition: A | Discharge status: Home / Self Care | Payer: Self-pay | Source: Ambulatory Visit | Attending: Medical Genetics | Admitting: Medical Genetics

## 2023-07-03 DIAGNOSIS — R262 Difficulty in walking, not elsewhere classified: Secondary | ICD-10-CM | POA: Diagnosis not present

## 2023-07-03 DIAGNOSIS — M25561 Pain in right knee: Secondary | ICD-10-CM | POA: Diagnosis not present

## 2023-07-03 DIAGNOSIS — S76111A Strain of right quadriceps muscle, fascia and tendon, initial encounter: Secondary | ICD-10-CM | POA: Diagnosis not present

## 2023-07-08 DIAGNOSIS — R262 Difficulty in walking, not elsewhere classified: Secondary | ICD-10-CM | POA: Diagnosis not present

## 2023-07-08 DIAGNOSIS — S76111A Strain of right quadriceps muscle, fascia and tendon, initial encounter: Secondary | ICD-10-CM | POA: Diagnosis not present

## 2023-07-08 DIAGNOSIS — M25561 Pain in right knee: Secondary | ICD-10-CM | POA: Diagnosis not present

## 2023-07-10 ENCOUNTER — Ambulatory Visit
Admission: RE | Admit: 2023-07-10 | Discharge: 2023-07-10 | Disposition: A | Discharge status: Home / Self Care | Payer: Medicare PPO | Source: Ambulatory Visit

## 2023-07-10 DIAGNOSIS — Z1231 Encounter for screening mammogram for malignant neoplasm of breast: Secondary | ICD-10-CM | POA: Diagnosis not present

## 2023-07-11 DIAGNOSIS — M25561 Pain in right knee: Secondary | ICD-10-CM | POA: Diagnosis not present

## 2023-07-11 DIAGNOSIS — S76111A Strain of right quadriceps muscle, fascia and tendon, initial encounter: Secondary | ICD-10-CM | POA: Diagnosis not present

## 2023-07-11 DIAGNOSIS — R262 Difficulty in walking, not elsewhere classified: Secondary | ICD-10-CM | POA: Diagnosis not present

## 2023-07-13 LAB — GENECONNECT MOLECULAR SCREEN: Genetic Analysis Overall Interpretation: NEGATIVE

## 2023-07-21 DIAGNOSIS — S76111A Strain of right quadriceps muscle, fascia and tendon, initial encounter: Secondary | ICD-10-CM | POA: Diagnosis not present

## 2023-07-21 DIAGNOSIS — M25561 Pain in right knee: Secondary | ICD-10-CM | POA: Diagnosis not present

## 2023-07-21 DIAGNOSIS — R262 Difficulty in walking, not elsewhere classified: Secondary | ICD-10-CM | POA: Diagnosis not present

## 2023-07-24 DIAGNOSIS — M25561 Pain in right knee: Secondary | ICD-10-CM | POA: Diagnosis not present

## 2023-07-24 DIAGNOSIS — S76111A Strain of right quadriceps muscle, fascia and tendon, initial encounter: Secondary | ICD-10-CM | POA: Diagnosis not present

## 2023-07-24 DIAGNOSIS — R262 Difficulty in walking, not elsewhere classified: Secondary | ICD-10-CM | POA: Diagnosis not present

## 2023-07-28 DIAGNOSIS — R262 Difficulty in walking, not elsewhere classified: Secondary | ICD-10-CM | POA: Diagnosis not present

## 2023-07-28 DIAGNOSIS — M25561 Pain in right knee: Secondary | ICD-10-CM | POA: Diagnosis not present

## 2023-07-28 DIAGNOSIS — S76111A Strain of right quadriceps muscle, fascia and tendon, initial encounter: Secondary | ICD-10-CM | POA: Diagnosis not present

## 2023-07-30 DIAGNOSIS — S76111A Strain of right quadriceps muscle, fascia and tendon, initial encounter: Secondary | ICD-10-CM | POA: Diagnosis not present

## 2023-07-30 DIAGNOSIS — R262 Difficulty in walking, not elsewhere classified: Secondary | ICD-10-CM | POA: Diagnosis not present

## 2023-07-30 DIAGNOSIS — M25561 Pain in right knee: Secondary | ICD-10-CM | POA: Diagnosis not present

## 2023-07-31 NOTE — Telephone Encounter (Signed)
 Last Prolia inj 03/06/23 Next Prolia inj due 09/05/23

## 2023-08-02 NOTE — Telephone Encounter (Signed)
 Prolia VOB initiated via [email or web address]  Next Prolia inj DUE: 09/05/23

## 2023-08-04 ENCOUNTER — Encounter: Payer: Self-pay | Admitting: *Deleted

## 2023-08-04 ENCOUNTER — Other Ambulatory Visit: Payer: Self-pay | Admitting: *Deleted

## 2023-08-04 DIAGNOSIS — R262 Difficulty in walking, not elsewhere classified: Secondary | ICD-10-CM | POA: Diagnosis not present

## 2023-08-04 DIAGNOSIS — M25561 Pain in right knee: Secondary | ICD-10-CM | POA: Diagnosis not present

## 2023-08-04 DIAGNOSIS — S76111A Strain of right quadriceps muscle, fascia and tendon, initial encounter: Secondary | ICD-10-CM | POA: Diagnosis not present

## 2023-08-04 DIAGNOSIS — M810 Age-related osteoporosis without current pathological fracture: Secondary | ICD-10-CM

## 2023-08-04 NOTE — Telephone Encounter (Signed)
 Patient is ready for scheduling on or after: 09/05/23 BUY AND BILL  Out-of-pocket cost due at time of visit: $80  Primary: [employer] Prolia co-insurance: $40 Admin fee co-insurance: $40  Deductible: n/a  Prior Auth: Approved PA#: [id number] Valid: 03/03/23 to 05/12/24     ** This summary of benefits is an estimation of the patient's out-of-pocket cost. Exact cost may vary based on individual plan coverage.

## 2023-08-07 DIAGNOSIS — R262 Difficulty in walking, not elsewhere classified: Secondary | ICD-10-CM | POA: Diagnosis not present

## 2023-08-07 DIAGNOSIS — S76111A Strain of right quadriceps muscle, fascia and tendon, initial encounter: Secondary | ICD-10-CM | POA: Diagnosis not present

## 2023-08-07 DIAGNOSIS — M25561 Pain in right knee: Secondary | ICD-10-CM | POA: Diagnosis not present

## 2023-08-11 DIAGNOSIS — R262 Difficulty in walking, not elsewhere classified: Secondary | ICD-10-CM | POA: Diagnosis not present

## 2023-08-11 DIAGNOSIS — S76111A Strain of right quadriceps muscle, fascia and tendon, initial encounter: Secondary | ICD-10-CM | POA: Diagnosis not present

## 2023-08-11 DIAGNOSIS — M25561 Pain in right knee: Secondary | ICD-10-CM | POA: Diagnosis not present

## 2023-08-12 DIAGNOSIS — L821 Other seborrheic keratosis: Secondary | ICD-10-CM | POA: Diagnosis not present

## 2023-08-12 DIAGNOSIS — L578 Other skin changes due to chronic exposure to nonionizing radiation: Secondary | ICD-10-CM | POA: Diagnosis not present

## 2023-08-12 DIAGNOSIS — Z85828 Personal history of other malignant neoplasm of skin: Secondary | ICD-10-CM | POA: Diagnosis not present

## 2023-08-12 DIAGNOSIS — L814 Other melanin hyperpigmentation: Secondary | ICD-10-CM | POA: Diagnosis not present

## 2023-08-12 DIAGNOSIS — D1801 Hemangioma of skin and subcutaneous tissue: Secondary | ICD-10-CM | POA: Diagnosis not present

## 2023-08-12 DIAGNOSIS — C44712 Basal cell carcinoma of skin of right lower limb, including hip: Secondary | ICD-10-CM | POA: Diagnosis not present

## 2023-08-12 DIAGNOSIS — M79672 Pain in left foot: Secondary | ICD-10-CM | POA: Diagnosis not present

## 2023-08-12 DIAGNOSIS — D225 Melanocytic nevi of trunk: Secondary | ICD-10-CM | POA: Diagnosis not present

## 2023-08-12 DIAGNOSIS — D2272 Melanocytic nevi of left lower limb, including hip: Secondary | ICD-10-CM | POA: Diagnosis not present

## 2023-08-12 DIAGNOSIS — D485 Neoplasm of uncertain behavior of skin: Secondary | ICD-10-CM | POA: Diagnosis not present

## 2023-08-13 DIAGNOSIS — S76111A Strain of right quadriceps muscle, fascia and tendon, initial encounter: Secondary | ICD-10-CM | POA: Diagnosis not present

## 2023-08-13 DIAGNOSIS — M25561 Pain in right knee: Secondary | ICD-10-CM | POA: Diagnosis not present

## 2023-08-13 DIAGNOSIS — R262 Difficulty in walking, not elsewhere classified: Secondary | ICD-10-CM | POA: Diagnosis not present

## 2023-08-25 ENCOUNTER — Other Ambulatory Visit (HOSPITAL_COMMUNITY): Payer: Self-pay

## 2023-08-26 ENCOUNTER — Other Ambulatory Visit (HOSPITAL_COMMUNITY)
Admission: RE | Admit: 2023-08-26 | Discharge: 2023-08-26 | Disposition: A | Discharge status: Home / Self Care | Source: Ambulatory Visit | Attending: Sports Medicine | Admitting: Sports Medicine

## 2023-08-26 DIAGNOSIS — M810 Age-related osteoporosis without current pathological fracture: Secondary | ICD-10-CM | POA: Diagnosis not present

## 2023-08-26 LAB — BASIC METABOLIC PANEL WITH GFR
Anion gap: 12 (ref 5–15)
BUN: 14 mg/dL (ref 8–23)
CO2: 26 mmol/L (ref 22–32)
Calcium: 10.7 mg/dL — ABNORMAL HIGH (ref 8.9–10.3)
Chloride: 100 mmol/L (ref 98–111)
Creatinine, Ser: 0.81 mg/dL (ref 0.44–1.00)
GFR, Estimated: >60 mL/min (ref >60)
Glucose, Bld: 88 mg/dL (ref 70–99)
Potassium: 4.1 mmol/L (ref 3.5–5.1)
Sodium: 138 mmol/L (ref 135–145)

## 2023-08-26 LAB — VITAMIN D 25 HYDROXY (VIT D DEFICIENCY, FRACTURES): Vit D, 25-Hydroxy: 114.76 ng/mL — ABNORMAL HIGH (ref 30–100)

## 2023-08-27 ENCOUNTER — Encounter: Payer: Self-pay | Admitting: Family Medicine

## 2023-08-27 NOTE — Progress Notes (Signed)
 Labs reviewed for Dr Peggy Bowens while he is out of office.  Message sent to patient to confirm Vit D intake.  Plan to decrease.

## 2023-08-29 DIAGNOSIS — M25561 Pain in right knee: Secondary | ICD-10-CM | POA: Diagnosis not present

## 2023-08-29 DIAGNOSIS — S76111A Strain of right quadriceps muscle, fascia and tendon, initial encounter: Secondary | ICD-10-CM | POA: Diagnosis not present

## 2023-08-29 DIAGNOSIS — R262 Difficulty in walking, not elsewhere classified: Secondary | ICD-10-CM | POA: Diagnosis not present

## 2023-09-01 DIAGNOSIS — M25561 Pain in right knee: Secondary | ICD-10-CM | POA: Diagnosis not present

## 2023-09-01 DIAGNOSIS — R262 Difficulty in walking, not elsewhere classified: Secondary | ICD-10-CM | POA: Diagnosis not present

## 2023-09-01 DIAGNOSIS — S76111A Strain of right quadriceps muscle, fascia and tendon, initial encounter: Secondary | ICD-10-CM | POA: Diagnosis not present

## 2023-09-03 DIAGNOSIS — R262 Difficulty in walking, not elsewhere classified: Secondary | ICD-10-CM | POA: Diagnosis not present

## 2023-09-03 DIAGNOSIS — S76111A Strain of right quadriceps muscle, fascia and tendon, initial encounter: Secondary | ICD-10-CM | POA: Diagnosis not present

## 2023-09-03 DIAGNOSIS — M25561 Pain in right knee: Secondary | ICD-10-CM | POA: Diagnosis not present

## 2023-09-08 DIAGNOSIS — S76111A Strain of right quadriceps muscle, fascia and tendon, initial encounter: Secondary | ICD-10-CM | POA: Diagnosis not present

## 2023-09-08 DIAGNOSIS — M25561 Pain in right knee: Secondary | ICD-10-CM | POA: Diagnosis not present

## 2023-09-08 DIAGNOSIS — R262 Difficulty in walking, not elsewhere classified: Secondary | ICD-10-CM | POA: Diagnosis not present

## 2023-09-09 ENCOUNTER — Ambulatory Visit: Payer: Medicare PPO | Admitting: Family Medicine

## 2023-09-09 DIAGNOSIS — M810 Age-related osteoporosis without current pathological fracture: Secondary | ICD-10-CM | POA: Diagnosis not present

## 2023-09-09 MED ORDER — DENOSUMAB 60 MG/ML ~~LOC~~ SOSY
60.0000 mg | PREFILLED_SYRINGE | Freq: Once | SUBCUTANEOUS | Status: AC
  Administered 2023-09-09: 60 mg via SUBCUTANEOUS

## 2023-09-09 NOTE — Progress Notes (Unsigned)
 Patient given [place] prolia injection 60mg /ml in the left arm. Patient tolerated injection well without reaction at the injection site. Patient will schedule next injection, which is 6 months from today.

## 2023-09-10 DIAGNOSIS — R262 Difficulty in walking, not elsewhere classified: Secondary | ICD-10-CM | POA: Diagnosis not present

## 2023-09-10 DIAGNOSIS — S76111A Strain of right quadriceps muscle, fascia and tendon, initial encounter: Secondary | ICD-10-CM | POA: Diagnosis not present

## 2023-09-10 DIAGNOSIS — M25561 Pain in right knee: Secondary | ICD-10-CM | POA: Diagnosis not present

## 2023-09-15 DIAGNOSIS — L905 Scar conditions and fibrosis of skin: Secondary | ICD-10-CM | POA: Diagnosis not present

## 2023-09-15 DIAGNOSIS — S76111A Strain of right quadriceps muscle, fascia and tendon, initial encounter: Secondary | ICD-10-CM | POA: Diagnosis not present

## 2023-09-15 DIAGNOSIS — M25561 Pain in right knee: Secondary | ICD-10-CM | POA: Diagnosis not present

## 2023-09-15 DIAGNOSIS — C44712 Basal cell carcinoma of skin of right lower limb, including hip: Secondary | ICD-10-CM | POA: Diagnosis not present

## 2023-09-15 DIAGNOSIS — R262 Difficulty in walking, not elsewhere classified: Secondary | ICD-10-CM | POA: Diagnosis not present

## 2023-09-23 ENCOUNTER — Other Ambulatory Visit (HOSPITAL_COMMUNITY): Payer: Self-pay

## 2023-09-23 MED ORDER — MUPIROCIN 2 % EX OINT
1.0000 | TOPICAL_OINTMENT | Freq: Two times a day (BID) | CUTANEOUS | 0 refills | Status: AC
  Filled 2023-09-23: qty 22, 11d supply, fill #0

## 2023-10-02 DIAGNOSIS — M25561 Pain in right knee: Secondary | ICD-10-CM | POA: Diagnosis not present

## 2023-10-02 DIAGNOSIS — S76111A Strain of right quadriceps muscle, fascia and tendon, initial encounter: Secondary | ICD-10-CM | POA: Diagnosis not present

## 2023-10-02 DIAGNOSIS — R262 Difficulty in walking, not elsewhere classified: Secondary | ICD-10-CM | POA: Diagnosis not present

## 2023-10-07 DIAGNOSIS — R262 Difficulty in walking, not elsewhere classified: Secondary | ICD-10-CM | POA: Diagnosis not present

## 2023-10-07 DIAGNOSIS — S76111A Strain of right quadriceps muscle, fascia and tendon, initial encounter: Secondary | ICD-10-CM | POA: Diagnosis not present

## 2023-10-07 DIAGNOSIS — M25561 Pain in right knee: Secondary | ICD-10-CM | POA: Diagnosis not present

## 2023-10-13 DIAGNOSIS — S76111A Strain of right quadriceps muscle, fascia and tendon, initial encounter: Secondary | ICD-10-CM | POA: Diagnosis not present

## 2023-10-13 DIAGNOSIS — R262 Difficulty in walking, not elsewhere classified: Secondary | ICD-10-CM | POA: Diagnosis not present

## 2023-10-13 DIAGNOSIS — M25561 Pain in right knee: Secondary | ICD-10-CM | POA: Diagnosis not present

## 2023-10-22 DIAGNOSIS — M25561 Pain in right knee: Secondary | ICD-10-CM | POA: Diagnosis not present

## 2023-10-22 DIAGNOSIS — R262 Difficulty in walking, not elsewhere classified: Secondary | ICD-10-CM | POA: Diagnosis not present

## 2023-10-22 DIAGNOSIS — S76111A Strain of right quadriceps muscle, fascia and tendon, initial encounter: Secondary | ICD-10-CM | POA: Diagnosis not present

## 2023-10-23 ENCOUNTER — Other Ambulatory Visit (HOSPITAL_COMMUNITY)
Admission: RE | Admit: 2023-10-23 | Discharge: 2023-10-23 | Disposition: A | Discharge status: Home / Self Care | Source: Ambulatory Visit | Attending: *Deleted | Admitting: *Deleted

## 2023-10-23 ENCOUNTER — Ambulatory Visit: Payer: Self-pay | Admitting: Family Medicine

## 2023-10-23 DIAGNOSIS — M810 Age-related osteoporosis without current pathological fracture: Secondary | ICD-10-CM | POA: Diagnosis not present

## 2023-10-23 LAB — VITAMIN D 25 HYDROXY (VIT D DEFICIENCY, FRACTURES): Vit D, 25-Hydroxy: 65.56 ng/mL (ref 30–100)

## 2023-10-23 LAB — BASIC METABOLIC PANEL WITH GFR
Anion gap: 9 (ref 5–15)
BUN: 14 mg/dL (ref 8–23)
CO2: 26 mmol/L (ref 22–32)
Calcium: 10 mg/dL (ref 8.9–10.3)
Chloride: 103 mmol/L (ref 98–111)
Creatinine, Ser: 0.75 mg/dL (ref 0.44–1.00)
GFR, Estimated: >60 mL/min (ref >60)
Glucose, Bld: 102 mg/dL — ABNORMAL HIGH (ref 70–99)
Potassium: 4.3 mmol/L (ref 3.5–5.1)
Sodium: 138 mmol/L (ref 135–145)

## 2023-10-28 DIAGNOSIS — S76111A Strain of right quadriceps muscle, fascia and tendon, initial encounter: Secondary | ICD-10-CM | POA: Diagnosis not present

## 2023-10-28 DIAGNOSIS — M25561 Pain in right knee: Secondary | ICD-10-CM | POA: Diagnosis not present

## 2023-10-28 DIAGNOSIS — R262 Difficulty in walking, not elsewhere classified: Secondary | ICD-10-CM | POA: Diagnosis not present

## 2023-10-30 DIAGNOSIS — S76111A Strain of right quadriceps muscle, fascia and tendon, initial encounter: Secondary | ICD-10-CM | POA: Diagnosis not present

## 2023-10-30 DIAGNOSIS — M25561 Pain in right knee: Secondary | ICD-10-CM | POA: Diagnosis not present

## 2023-10-30 DIAGNOSIS — R262 Difficulty in walking, not elsewhere classified: Secondary | ICD-10-CM | POA: Diagnosis not present

## 2023-11-03 DIAGNOSIS — M25561 Pain in right knee: Secondary | ICD-10-CM | POA: Diagnosis not present

## 2023-11-03 DIAGNOSIS — R262 Difficulty in walking, not elsewhere classified: Secondary | ICD-10-CM | POA: Diagnosis not present

## 2023-11-03 DIAGNOSIS — S76111A Strain of right quadriceps muscle, fascia and tendon, initial encounter: Secondary | ICD-10-CM | POA: Diagnosis not present

## 2023-11-19 DIAGNOSIS — R262 Difficulty in walking, not elsewhere classified: Secondary | ICD-10-CM | POA: Diagnosis not present

## 2023-11-19 DIAGNOSIS — M25561 Pain in right knee: Secondary | ICD-10-CM | POA: Diagnosis not present

## 2023-11-19 DIAGNOSIS — S76111A Strain of right quadriceps muscle, fascia and tendon, initial encounter: Secondary | ICD-10-CM | POA: Diagnosis not present

## 2023-11-27 DIAGNOSIS — M25561 Pain in right knee: Secondary | ICD-10-CM | POA: Diagnosis not present

## 2023-11-27 DIAGNOSIS — R262 Difficulty in walking, not elsewhere classified: Secondary | ICD-10-CM | POA: Diagnosis not present

## 2023-11-27 DIAGNOSIS — S76111A Strain of right quadriceps muscle, fascia and tendon, initial encounter: Secondary | ICD-10-CM | POA: Diagnosis not present

## 2024-01-04 ENCOUNTER — Other Ambulatory Visit (HOSPITAL_COMMUNITY): Payer: Self-pay

## 2024-01-04 DIAGNOSIS — L259 Unspecified contact dermatitis, unspecified cause: Secondary | ICD-10-CM | POA: Diagnosis not present

## 2024-01-04 MED ORDER — PREDNISONE 20 MG PO TABS
ORAL_TABLET | ORAL | 0 refills | Status: AC
  Filled 2024-01-04: qty 27, 12d supply, fill #0

## 2024-01-04 MED ORDER — HYDROXYZINE HCL 25 MG PO TABS
25.0000 mg | ORAL_TABLET | Freq: Three times a day (TID) | ORAL | 0 refills | Status: AC
  Filled 2024-01-04: qty 30, 10d supply, fill #0

## 2024-01-05 ENCOUNTER — Other Ambulatory Visit (HOSPITAL_COMMUNITY): Payer: Self-pay

## 2024-01-06 ENCOUNTER — Other Ambulatory Visit (HOSPITAL_BASED_OUTPATIENT_CLINIC_OR_DEPARTMENT_OTHER): Payer: Self-pay

## 2024-01-06 DIAGNOSIS — L237 Allergic contact dermatitis due to plants, except food: Secondary | ICD-10-CM | POA: Diagnosis not present

## 2024-01-06 MED ORDER — CLOBETASOL PROPIONATE 0.05 % EX CREA
TOPICAL_CREAM | CUTANEOUS | 0 refills | Status: AC
  Filled 2024-01-06 (×2): qty 60, 30d supply, fill #0

## 2024-01-07 ENCOUNTER — Other Ambulatory Visit (HOSPITAL_COMMUNITY): Payer: Self-pay

## 2024-01-22 ENCOUNTER — Other Ambulatory Visit (HOSPITAL_COMMUNITY): Payer: Self-pay

## 2024-01-22 MED ORDER — COVID-19 MRNA VAC-TRIS(PFIZER) 30 MCG/0.3ML IM SUSY
PREFILLED_SYRINGE | INTRAMUSCULAR | 0 refills | Status: AC
  Filled 2024-01-23: qty 0.3, 1d supply, fill #0

## 2024-01-23 ENCOUNTER — Other Ambulatory Visit (HOSPITAL_COMMUNITY): Payer: Self-pay

## 2024-01-23 MED ORDER — FLUZONE HIGH-DOSE 0.5 ML IM SUSY
0.5000 mL | PREFILLED_SYRINGE | Freq: Once | INTRAMUSCULAR | 0 refills | Status: AC
  Filled 2024-01-23: qty 0.5, 1d supply, fill #0

## 2024-02-13 NOTE — Telephone Encounter (Signed)
 [facility] [employer] [facility] [employer]  Patient is ready for scheduling on or after: 03/11/24   Out-of-pocket cost due at time of visit: $390.84  Primary: [employer] Prolia  co-insurance: 20% (approximately $350.84) up tp a $4000 OOP max, once met, coverage increases to 100%.  Admin fee co-insurance: 20% (approximately $40)  Deductible: n/a  Prior Auth: Approved PA#: [id number] Valid: 03/03/23 to 05/12/25   ** This summary of benefits is an estimation of the patient's out-of-pocket cost. Exact cost may vary based on individual plan coverage.

## 2024-02-15 ENCOUNTER — Encounter: Payer: Self-pay | Admitting: *Deleted

## 2024-02-15 ENCOUNTER — Other Ambulatory Visit (HOSPITAL_COMMUNITY): Payer: Self-pay

## 2024-02-15 ENCOUNTER — Other Ambulatory Visit: Payer: Self-pay | Admitting: *Deleted

## 2024-02-15 MED ORDER — DENOSUMAB 60 MG/ML ~~LOC~~ SOSY
60.0000 mg | PREFILLED_SYRINGE | Freq: Once | SUBCUTANEOUS | 0 refills | Status: AC
  Filled 2024-02-15: qty 1, 1d supply, fill #0
  Filled 2024-02-16: qty 1, 180d supply, fill #0

## 2024-02-16 ENCOUNTER — Other Ambulatory Visit: Payer: Self-pay

## 2024-02-16 ENCOUNTER — Other Ambulatory Visit: Payer: Self-pay | Admitting: *Deleted

## 2024-02-16 ENCOUNTER — Other Ambulatory Visit (HOSPITAL_COMMUNITY): Payer: Self-pay

## 2024-02-16 ENCOUNTER — Encounter (HOSPITAL_COMMUNITY): Payer: Self-pay

## 2024-02-16 DIAGNOSIS — M810 Age-related osteoporosis without current pathological fracture: Secondary | ICD-10-CM

## 2024-02-16 NOTE — Progress Notes (Signed)
 Specialty Pharmacy Initial Fill Coordination Note  Natasha Turner is a 68 y.o. female contacted today regarding initial fill of specialty medication(s) Denosumab  (PROLIA )   Patient requested Courier to Provider Office   Delivery date: 03/02/24   [facility] [address] [place] [address]   Medication will be filled on 10/20.   Patient is aware of $64 copayment.

## 2024-02-16 NOTE — Progress Notes (Signed)
 Pharmacy Patient Advocate Encounter  Insurance verification completed.   The patient is insured through [employer]   Ran test claim for Prolia. Co-pay is $64.  This test claim was processed through [facility]- copay amounts may vary at other pharmacies due to pharmacy/plan contracts, or as the patient moves through the different stages of their insurance plan.

## 2024-02-19 ENCOUNTER — Ambulatory Visit: Payer: Self-pay | Admitting: Family Medicine

## 2024-02-19 ENCOUNTER — Other Ambulatory Visit (HOSPITAL_COMMUNITY)
Admission: AD | Admit: 2024-02-19 | Discharge: 2024-02-19 | Disposition: A | Discharge status: Home / Self Care | Source: Ambulatory Visit | Attending: Family Medicine | Admitting: Family Medicine

## 2024-02-19 DIAGNOSIS — M810 Age-related osteoporosis without current pathological fracture: Secondary | ICD-10-CM | POA: Insufficient documentation

## 2024-02-19 LAB — BASIC METABOLIC PANEL WITH GFR
Anion gap: 12 (ref 5–15)
BUN: 15 mg/dL (ref 8–23)
CO2: 24 mmol/L (ref 22–32)
Calcium: 9.7 mg/dL (ref 8.9–10.3)
Chloride: 101 mmol/L (ref 98–111)
Creatinine, Ser: 0.84 mg/dL (ref 0.44–1.00)
GFR, Estimated: >60 mL/min (ref >60)
Glucose, Bld: 106 mg/dL — ABNORMAL HIGH (ref 70–99)
Potassium: 4.4 mmol/L (ref 3.5–5.1)
Sodium: 137 mmol/L (ref 135–145)

## 2024-02-19 LAB — VITAMIN D 25 HYDROXY (VIT D DEFICIENCY, FRACTURES): Vit D, 25-Hydroxy: 42.48 ng/mL (ref 30–100)

## 2024-03-01 ENCOUNTER — Other Ambulatory Visit: Payer: Self-pay

## 2024-03-11 ENCOUNTER — Ambulatory Visit: Admitting: Family Medicine

## 2024-03-11 DIAGNOSIS — M810 Age-related osteoporosis without current pathological fracture: Secondary | ICD-10-CM | POA: Diagnosis not present

## 2024-03-11 MED ORDER — DENOSUMAB 60 MG/ML ~~LOC~~ SOSY
60.0000 mg | PREFILLED_SYRINGE | Freq: Once | SUBCUTANEOUS | Status: AC
  Administered 2024-03-11: 60 mg via SUBCUTANEOUS

## 2024-03-11 NOTE — Progress Notes (Signed)
 Patient given [place] prolia  injection 60mg /ml in the left arm. Pt supplied medication via [facility] [place] pharmacy. Patient tolerated injection well without reaction at the injection site. Patient will schedule next injection, which is 6 months from today.

## 2024-06-17 ENCOUNTER — Other Ambulatory Visit: Payer: Self-pay | Admitting: Family Medicine

## 2024-06-17 DIAGNOSIS — Z1231 Encounter for screening mammogram for malignant neoplasm of breast: Secondary | ICD-10-CM

## 2024-07-12 ENCOUNTER — Ambulatory Visit

## 2024-07-12 DIAGNOSIS — Z1231 Encounter for screening mammogram for malignant neoplasm of breast: Secondary | ICD-10-CM

## 2024-09-10 ENCOUNTER — Ambulatory Visit: Admitting: Family Medicine
